# Patient Record
Sex: Female | Born: 1990 | Race: White | Hispanic: No | Marital: Single | Smoking: Never smoker
Health system: Southern US, Community
[De-identification: ages and names within clinical notes are randomized; demographics above are authoritative.]

## PROBLEM LIST (undated history)

## (undated) ENCOUNTER — Emergency Department (HOSPITAL_COMMUNITY): Admission: EM | Payer: 59

## (undated) DIAGNOSIS — R339 Retention of urine, unspecified: Secondary | ICD-10-CM

## (undated) DIAGNOSIS — R350 Frequency of micturition: Secondary | ICD-10-CM

## (undated) DIAGNOSIS — Z8742 Personal history of other diseases of the female genital tract: Secondary | ICD-10-CM

## (undated) DIAGNOSIS — G629 Polyneuropathy, unspecified: Secondary | ICD-10-CM

## (undated) DIAGNOSIS — F329 Major depressive disorder, single episode, unspecified: Secondary | ICD-10-CM

## (undated) DIAGNOSIS — A09 Infectious gastroenteritis and colitis, unspecified: Secondary | ICD-10-CM

## (undated) DIAGNOSIS — S8990XA Unspecified injury of unspecified lower leg, initial encounter: Secondary | ICD-10-CM

## (undated) DIAGNOSIS — G8929 Other chronic pain: Secondary | ICD-10-CM

## (undated) DIAGNOSIS — Z8719 Personal history of other diseases of the digestive system: Secondary | ICD-10-CM

## (undated) DIAGNOSIS — R87629 Unspecified abnormal cytological findings in specimens from vagina: Secondary | ICD-10-CM

## (undated) DIAGNOSIS — D259 Leiomyoma of uterus, unspecified: Secondary | ICD-10-CM

## (undated) DIAGNOSIS — Z8781 Personal history of (healed) traumatic fracture: Secondary | ICD-10-CM

## (undated) HISTORY — DX: Personal history of (healed) traumatic fracture: Z87.81

## (undated) HISTORY — DX: Unspecified injury of unspecified lower leg, initial encounter: S89.90XA

## (undated) HISTORY — DX: Infectious gastroenteritis and colitis, unspecified: A09

## (undated) HISTORY — DX: Unspecified abnormal cytological findings in specimens from vagina: R87.629

## (undated) HISTORY — PX: APPENDECTOMY: SHX54

---
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
## 2009-08-31 HISTORY — PX: LAPAROSCOPIC APPENDECTOMY: SHX408

## 2021-03-12 LAB — RESULTS CONSOLE HPV: CHL HPV: NEGATIVE

## 2021-03-12 LAB — HM PAP SMEAR: HM Pap smear: NORMAL
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
## 2021-10-02 ENCOUNTER — Encounter: Payer: Self-pay | Admitting: Nurse Practitioner

## 2021-10-02 ENCOUNTER — Ambulatory Visit (INDEPENDENT_AMBULATORY_CARE_PROVIDER_SITE_OTHER): Payer: 59 | Admitting: Nurse Practitioner

## 2021-10-02 ENCOUNTER — Other Ambulatory Visit: Payer: Self-pay

## 2021-10-02 VITALS — BP 110/88 | HR 81 | Temp 97.2°F | Ht 70.0 in | Wt 188.4 lb

## 2021-10-02 DIAGNOSIS — Z8639 Personal history of other endocrine, nutritional and metabolic disease: Secondary | ICD-10-CM | POA: Diagnosis not present

## 2021-10-02 DIAGNOSIS — R200 Anesthesia of skin: Secondary | ICD-10-CM | POA: Diagnosis not present

## 2021-10-02 DIAGNOSIS — R350 Frequency of micturition: Secondary | ICD-10-CM | POA: Diagnosis not present

## 2021-10-02 DIAGNOSIS — G8929 Other chronic pain: Secondary | ICD-10-CM | POA: Insufficient documentation

## 2021-10-02 DIAGNOSIS — Z7689 Persons encountering health services in other specified circumstances: Secondary | ICD-10-CM

## 2021-10-02 DIAGNOSIS — M25562 Pain in left knee: Secondary | ICD-10-CM

## 2021-10-02 DIAGNOSIS — Z1159 Encounter for screening for other viral diseases: Secondary | ICD-10-CM

## 2021-10-02 DIAGNOSIS — Z23 Encounter for immunization: Secondary | ICD-10-CM

## 2021-10-02 DIAGNOSIS — Z114 Encounter for screening for human immunodeficiency virus [HIV]: Secondary | ICD-10-CM

## 2021-10-02 DIAGNOSIS — R202 Paresthesia of skin: Secondary | ICD-10-CM

## 2021-10-02 DIAGNOSIS — F321 Major depressive disorder, single episode, moderate: Secondary | ICD-10-CM | POA: Insufficient documentation

## 2021-10-02 DIAGNOSIS — L659 Nonscarring hair loss, unspecified: Secondary | ICD-10-CM

## 2021-10-02 LAB — VITAMIN B12: Vitamin B-12: 504 pg/mL (ref 211–911)

## 2021-10-02 LAB — VITAMIN D 25 HYDROXY (VIT D DEFICIENCY, FRACTURES): VITD: 24.06 ng/mL — ABNORMAL LOW (ref 30.00–100.00)

## 2021-10-02 LAB — POCT URINALYSIS DIPSTICK
Bilirubin, UA: NEGATIVE
Blood, UA: NEGATIVE
Glucose, UA: NEGATIVE
Ketones, UA: NEGATIVE
Nitrite, UA: NEGATIVE
Protein, UA: NEGATIVE
Spec Grav, UA: 1.015 (ref 1.010–1.025)
Urobilinogen, UA: 1 E.U./dL
pH, UA: 7 (ref 5.0–8.0)

## 2021-10-02 LAB — CBC WITH DIFFERENTIAL/PLATELET
Basophils Absolute: 0.1 10*3/uL (ref 0.0–0.1)
Basophils Relative: 0.9 % (ref 0.0–3.0)
Eosinophils Absolute: 0.2 10*3/uL (ref 0.0–0.7)
Eosinophils Relative: 3.4 % (ref 0.0–5.0)
HCT: 42.6 % (ref 36.0–46.0)
Hemoglobin: 13.9 g/dL (ref 12.0–15.0)
Lymphocytes Relative: 23.9 % (ref 12.0–46.0)
Lymphs Abs: 1.5 10*3/uL (ref 0.7–4.0)
MCHC: 32.6 g/dL (ref 30.0–36.0)
MCV: 87.9 fl (ref 78.0–100.0)
Monocytes Absolute: 0.4 10*3/uL (ref 0.1–1.0)
Monocytes Relative: 6.4 % (ref 3.0–12.0)
Neutro Abs: 4.1 10*3/uL (ref 1.4–7.7)
Neutrophils Relative %: 65.4 % (ref 43.0–77.0)
Platelets: 119 10*3/uL — ABNORMAL LOW (ref 150.0–400.0)
RBC: 4.84 Mil/uL (ref 3.87–5.11)
RDW: 13.5 % (ref 11.5–15.5)
WBC: 6.3 10*3/uL (ref 4.0–10.5)

## 2021-10-02 LAB — COMPREHENSIVE METABOLIC PANEL
ALT: 8 U/L (ref 0–35)
AST: 12 U/L (ref 0–37)
Albumin: 4.4 g/dL (ref 3.5–5.2)
Alkaline Phosphatase: 82 U/L (ref 39–117)
BUN: 9 mg/dL (ref 6–23)
CO2: 26 mEq/L (ref 19–32)
Calcium: 9.4 mg/dL (ref 8.4–10.5)
Chloride: 106 mEq/L (ref 96–112)
Creatinine, Ser: 0.66 mg/dL (ref 0.40–1.20)
GFR: 117.16 mL/min (ref >60.00)
Glucose, Bld: 88 mg/dL (ref 70–99)
Potassium: 4.3 mEq/L (ref 3.5–5.1)
Sodium: 139 mEq/L (ref 135–145)
Total Bilirubin: 0.5 mg/dL (ref 0.2–1.2)
Total Protein: 7 g/dL (ref 6.0–8.3)

## 2021-10-02 LAB — TSH: TSH: 0.84 u[IU]/mL (ref 0.35–5.50)

## 2021-10-02 LAB — HEMOGLOBIN A1C: Hgb A1c MFr Bld: 5.3 % (ref 4.6–6.5)

## 2021-10-02 LAB — LUTEINIZING HORMONE: LH: 84.73 m[IU]/mL

## 2021-10-02 LAB — FOLLICLE STIMULATING HORMONE: FSH: 20.3 m[IU]/mL

## 2021-10-02 NOTE — Patient Instructions (Signed)
It was great to see you!  We are going to check some blood work today and will call you with the results.   I have placed a referral to orthopedics and therapy for you.   Let's follow-up in 2 months, sooner if you have concerns.  If a referral was placed today, you will be contacted for an appointment. Please note that routine referrals can sometimes take up to 3-4 weeks to process. Please call our office if you haven't heard anything after this time frame.  Take care,  Vance Peper, NP
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
## 2021-10-02 NOTE — Addendum Note (Signed)
Addended by: Marchia Bond on: 10/02/2021 04:03 PM   Modules accepted: Orders

## 2021-10-02 NOTE — Assessment & Plan Note (Signed)
Chronic pain after fall on her knee in 2017. Pain has worsened over the past few months. She has done PT in the past and had prior MRI. Will request records for imaging and records. She can take ibuprofen as needed for pain, continue using ice. Will place referral to orthopedics for further evaluation.

## 2021-10-02 NOTE — Assessment & Plan Note (Signed)
Ongoing for several years with loss of her job and moving from [place]. We discussed lifestyle treatments including meditation and journaling. We also discussed medication and therapy. She would like to hold off on medication for now, but would like to speak with a therapist. Referral placed to psychiatry.

## 2021-10-03 LAB — URINE CULTURE
MICRO NUMBER:: 12955235
Result:: NO GROWTH
SPECIMEN QUALITY:: ADEQUATE

## 2021-10-03 LAB — HIV ANTIBODY (ROUTINE TESTING W REFLEX): HIV 1&2 Ab, 4th Generation: NONREACTIVE

## 2021-10-03 LAB — HEPATITIS C ANTIBODY
Hepatitis C Ab: NONREACTIVE
SIGNAL TO CUT-OFF: 0.1 (ref <1.00)

## 2021-10-06 ENCOUNTER — Ambulatory Visit (INDEPENDENT_AMBULATORY_CARE_PROVIDER_SITE_OTHER): Payer: 59 | Admitting: Orthopaedic Surgery

## 2021-10-06 ENCOUNTER — Ambulatory Visit (INDEPENDENT_AMBULATORY_CARE_PROVIDER_SITE_OTHER): Payer: 59

## 2021-10-06 ENCOUNTER — Other Ambulatory Visit: Payer: Self-pay

## 2021-10-06 DIAGNOSIS — G8929 Other chronic pain: Secondary | ICD-10-CM | POA: Diagnosis not present

## 2021-10-06 DIAGNOSIS — M25562 Pain in left knee: Secondary | ICD-10-CM

## 2021-10-06 MED ORDER — METHYLPREDNISOLONE ACETATE 40 MG/ML IJ SUSP
40.0000 mg | INTRAMUSCULAR | Status: AC | PRN
  Administered 2021-10-06: 40 mg via INTRA_ARTICULAR

## 2021-10-06 MED ORDER — MELOXICAM 15 MG PO TABS: 15.0000 mg | ORAL_TABLET | Freq: Every day | ORAL | 3 refills | Status: DC

## 2021-10-06 MED ORDER — GABAPENTIN 300 MG PO CAPS: 300.0000 mg | ORAL_CAPSULE | Freq: Every day | ORAL | 0 refills | Status: DC

## 2021-10-06 MED ORDER — LIDOCAINE HCL 1 % IJ SOLN
3.0000 mL | INTRAMUSCULAR | Status: AC | PRN
  Administered 2021-10-06: 3 mL

## 2021-10-06 NOTE — Progress Notes (Signed)
++++++++++++++++++++++++++++++++++++++++++++++++++++++++++++++++++++++++++++++++++++++++++++++++++++++++++++++++++++++++++++++++++++++++++++++++++++++++++++++++++++++++++++++++++++++++++++++++++++++++++++++++++++++++++++++++++++++++++++++++++   Office Visit Note   Patient: Dominique [name]           Date of Birth: 1991/02/08           MRN: [id number] Visit Date: 10/06/2021              Requested by: [name], NP [place],  [place] [id number] PCP: [name], NP   Assessment & Plan: Visit Diagnoses:  1. Chronic pain of left knee     Plan: I did place a steroid injection today in her left knee.  I will start her on meloxicam and Neurontin given the neurologic burning sensation she is having at bedtime.  I would like to obtain an MRI at this point to assess the cartilage of her left knee and to rule out a medial meniscal tear based on her clinical exam findings and failure of conservative treatment.  We will see her back in about 3 weeks to hopefully go over the MRI of her left knee.  She did tolerate the steroid injection well today in her left knee.  All questions and concerns were answered and addressed.  She will work on Forensic scientist exercises as well.  Follow-Up Instructions: Return in about 3 weeks (around 10/27/2021).   Orders:  Orders Placed This Encounter  Procedures   Large Joint Inj   XR Knee 1-2 Views Left   Meds ordered this encounter  Medications   gabapentin (NEURONTIN) 300 MG capsule    Sig: Take 1 capsule (300 mg total) by mouth at bedtime.    Dispense:  30 capsule    Refill:  0   meloxicam (MOBIC) 15 MG tablet    Sig: Take 1 tablet (15 mg total) by mouth daily.    Dispense:  30 tablet    Refill:  3      Procedures: Large Joint Inj: L knee on 10/06/2021 1:17 PM Indications: diagnostic evaluation and pain Details: 22 G 1.5 in needle, superolateral approach  Arthrogram: No  Medications: 3 mL lidocaine 1 %; 40 mg  methylPREDNISolone acetate 40 MG/ML Outcome: tolerated well, no immediate complications Procedure, treatment alternatives, risks and benefits explained, specific risks discussed. Consent was given by the patient. Immediately prior to procedure a time out was called to verify the correct patient, procedure, equipment, support staff and site/side marked as required. Patient was prepped and draped in the usual sterile fashion.      Clinical Data: No additional findings.   Subjective: Chief Complaint  Patient presents with   Left Knee - Pain  The patient is a very pleasant 31 year old female has been dealing with 5 and half years of worsening left knee pain.  She has been having burning and stinging in the knee and a sensation that goes down the lateral aspect of both feet.  She is having some right right knee pain recently just from compensating due to her left chronic knee pain.  She had a work-up of this knee in [place] when she was there when she first injured her knee and a mechanical fall.  She did have swelling in the knee and they tried a steroid injection which helped only a little bit.  She continues to have swelling as well as locking catching without left knee.  She is an active individual and has tried quad training exercises and takes over-the-counter anti-inflammatories.  She does walk with a slight limp favoring the left knee.  HPI  Review of Systems  There is no current listed fever, chills, nausea, vomiting  Objective: Vital Signs: LMP 09/15/2021 (Exact Date)   Physical Exam She is alert and orient x3 and in no acute distress.  She does walk with a slight limp. Ortho Exam Her left knee shows no effusion today.  Range of motion is full.  She has tenderness over the medial joint line and the patella tendon and a positive McMurray's exam to the medial compartment of the knee. Specialty Comments:  No specialty comments available.  Imaging: XR Knee 1-2 Views Left  Result  Date: 10/06/2021 2 views of the left knee are normal with good alignment and well-maintained joint space.  There is no acute findings.    PMFS History: Patient Active Problem List   Diagnosis Date Noted   Depression, major, single episode, moderate ([place]) 10/02/2021   Numbness and tingling of foot 10/02/2021   Chronic pain of left knee 10/02/2021   Past Medical History:  Diagnosis Date   History of fracture of right ankle    Infectious colitis    was hospitalized in 2017   Knee injury    2017    Family History  Problem Relation Age of Onset   Rheum arthritis Mother    Cancer Father        Lung   Cancer Maternal Grandmother        breast cancer   Diabetes Maternal Grandmother    Cancer Paternal Grandmother        Uterine   Cancer Paternal Grandfather        Lung cancer    Past Surgical History:  Procedure Laterality Date   APPENDECTOMY     Social History   Occupational History   Not on file  Tobacco Use   Smoking status: Never   Smokeless tobacco: Never  Vaping Use   Vaping Use: Every day  Substance and Sexual Activity   Alcohol use: Not Currently   Drug use: Yes    Types: Marijuana   Sexual activity: Not Currently

## 2021-10-11 ENCOUNTER — Ambulatory Visit (HOSPITAL_COMMUNITY)
Admission: RE | Admit: 2021-10-11 | Discharge: 2021-10-11 | Disposition: A | Discharge status: Home / Self Care | Payer: 59 | Source: Ambulatory Visit | Attending: Orthopaedic Surgery | Admitting: Orthopaedic Surgery

## 2021-10-11 ENCOUNTER — Other Ambulatory Visit: Payer: Self-pay

## 2021-10-11 DIAGNOSIS — G8929 Other chronic pain: Secondary | ICD-10-CM | POA: Insufficient documentation

## 2021-10-11 DIAGNOSIS — M25562 Pain in left knee: Secondary | ICD-10-CM | POA: Diagnosis not present

## 2021-10-27 ENCOUNTER — Encounter: Payer: Self-pay | Admitting: Orthopaedic Surgery

## 2021-10-27 ENCOUNTER — Ambulatory Visit (INDEPENDENT_AMBULATORY_CARE_PROVIDER_SITE_OTHER): Payer: 59 | Admitting: Orthopaedic Surgery

## 2021-10-27 DIAGNOSIS — G8929 Other chronic pain: Secondary | ICD-10-CM

## 2021-10-27 DIAGNOSIS — M25562 Pain in left knee: Secondary | ICD-10-CM

## 2021-10-27 NOTE — Progress Notes (Signed)
The patient comes in today to go over MRI of her left knee.  She has been having some chronic issues with her left knee and it failed conservative treatment.  I did place a steroid injection in her left knee joint and she said that is helped greatly.  She did have about 24 to 48 hours of worsening left knee pain after the injection but now she is doing great.  We did obtain an MRI of that knee given the chronic pain she was having.  Her knee exam is entirely normal today.  The MRI did show some edema in Hoffa's fat suggesting a friction type of syndrome but otherwise the meniscus and cartilage as well as ligaments were all normal.  I gave her reassurance that her knee should do well with time.  I would not recommend any type of arthroscopic intervention and that she failed conservative treatment with at least 3 steroid injections for over a year and physical therapy.  Right now she is doing well.  All question concerns were answered and addressed.  Follow-up is as needed.

## 2021-12-09 ENCOUNTER — Ambulatory Visit (INDEPENDENT_AMBULATORY_CARE_PROVIDER_SITE_OTHER): Payer: 59 | Admitting: Nurse Practitioner

## 2021-12-09 ENCOUNTER — Encounter: Payer: Self-pay | Admitting: Nurse Practitioner

## 2021-12-09 VITALS — BP 120/84 | HR 72 | Temp 97.9°F | Wt 183.0 lb

## 2021-12-09 DIAGNOSIS — R21 Rash and other nonspecific skin eruption: Secondary | ICD-10-CM | POA: Diagnosis not present

## 2021-12-09 DIAGNOSIS — R339 Retention of urine, unspecified: Secondary | ICD-10-CM | POA: Insufficient documentation

## 2021-12-09 LAB — POCT URINALYSIS DIPSTICK
Bilirubin, UA: NEGATIVE
Blood, UA: NEGATIVE
Glucose, UA: NEGATIVE
Ketones, UA: 5
Leukocytes, UA: NEGATIVE
Nitrite, UA: NEGATIVE
Protein, UA: NEGATIVE
Spec Grav, UA: 1.02 (ref 1.010–1.025)
Urobilinogen, UA: 0.2 E.U./dL
pH, UA: 7 (ref 5.0–8.0)

## 2021-12-09 MED ORDER — METRONIDAZOLE 0.75 % EX GEL: 1.0000 "application " | Freq: Two times a day (BID) | CUTANEOUS | 0 refills | Status: DC

## 2021-12-09 MED ORDER — TRIAMCINOLONE ACETONIDE 0.1 % EX CREA: 1.0000 "application " | TOPICAL_CREAM | Freq: Two times a day (BID) | CUTANEOUS | 0 refills | Status: DC

## 2021-12-09 NOTE — Assessment & Plan Note (Signed)
She has a rash to her face and forearms that has worsened over the past month. She is wearing makeup today, but a picture on her phone shows redness and inflammation to her cheeks, nose, and forehead. Will treat with triamcinolone cream twice a day to the patches on her forearms and leg and metronidazole gel twice a day to her face. Will also check ANA to rule out lupus from the rash. Follow up if symptoms worsen or don't improve.  ?

## 2021-12-09 NOTE — Patient Instructions (Signed)
It was great to see you! ? ?We are checking your ANA for autoimmune diseases and U/A. I am placing a referral to urology for further evaluation on the difficulty to start urinating.  ? ?Start triamcinolone cream twice a day to the rash on your arms/leg. Start metronidazole gel to your cheeks/forehead twice a day.  ? ?Let's follow-up if your symptoms don't improve or worsen.  ? ?If a referral was placed today, you will be contacted for an appointment. Please note that routine referrals can sometimes take up to 3-4 weeks to process. Please call our office if you haven't heard anything after this time frame. ? ?Take care, ? ?Vance Peper, NP ? ?

## 2021-12-09 NOTE — Progress Notes (Signed)
? ?Acute Office Visit ? ?Subjective:  ? ? Patient ID: Dominique [name], female    DOB: 1990-10-07, 31 y.o.   MRN: [id number] ? ?Chief Complaint  ?Patient presents with  ? Follow-up  ?  Pt c/o bloating, urinary frequency and a feeling of not being able to release urine from bladder unless she pushes down on her abd, symptoms started x3 days  ? ? ?HPI ?Patient is in today for urinary frequency, bloating and having trouble starting her urinary stream, especially in the morning. She states that for the past 3 days, she needed to push on her stomach to start urinating in the mornings. Endorses a strong smell to her urine. Denies fevers, nausea, vomiting. She does endorse some back pain. Denies saddle anesthesia.  ? ?She also endorses a rash to her face along her cheeks and forehead. She feels like there is heat coming from it and feels a slight burning sensation. The was worsened over the past month. She also noticed small patches of dry, bumpy skin to her forearms and the top of her right knee. She denies any recent changes in soap, shampoo and laundry detergent.  ? ?Past Medical History:  ?Diagnosis Date  ? History of fracture of right ankle   ? Infectious colitis   ? was hospitalized in 2017  ? Knee injury   ? 2017  ? ? ?Past Surgical History:  ?Procedure Laterality Date  ? APPENDECTOMY    ? ? ?Family History  ?Problem Relation Age of Onset  ? Rheum arthritis Mother   ? Cancer Father   ?     Lung  ? Cancer Maternal Grandmother   ?     breast cancer  ? Diabetes Maternal Grandmother   ? Cancer Paternal Grandmother   ?     Uterine  ? Cancer Paternal Grandfather   ?     Lung cancer  ? ? ?Social History  ? ?Socioeconomic History  ? Marital status: Single  ?  Spouse name: Not on file  ? Number of children: Not on file  ? Years of education: Not on file  ? Highest education level: Not on file  ?Occupational History  ? Not on file  ?Tobacco Use  ? Smoking status: Never  ? Smokeless tobacco: Never  ?Vaping Use  ? Vaping Use:  Every day  ?Substance and Sexual Activity  ? Alcohol use: Not Currently  ? Drug use: Yes  ?  Types: Marijuana  ? Sexual activity: Not Currently  ?Other Topics Concern  ? Not on file  ?Social History Narrative  ? Not on file  ? ?Social Determinants of Health  ? ?Financial Resource Strain: Not on file  ?Food Insecurity: Not on file  ?Transportation Needs: Not on file  ?Physical Activity: Not on file  ?Stress: Not on file  ?Social Connections: Not on file  ?Intimate Partner Violence: Not on file  ? ? ?Outpatient Medications Prior to Visit  ?Medication Sig Dispense Refill  ? gabapentin (NEURONTIN) 300 MG capsule Take 1 capsule (300 mg total) by mouth at bedtime. 30 capsule 0  ? meloxicam (MOBIC) 15 MG tablet Take 1 tablet (15 mg total) by mouth daily. 30 tablet 3  ? ?No facility-administered medications prior to visit.  ? ? ?Allergies  ?Allergen Reactions  ? Ciprofloxacin Hives  ? ? ?Review of Systems ?See pertinent positives and negatives per HPI. ?   ?Objective:  ?  ?Physical Exam ?Vitals and nursing note reviewed.  ?Constitutional:   ?  General: She is not in acute distress. ?   Appearance: Normal appearance.  ?HENT:  ?   Head: Normocephalic.  ?Eyes:  ?   Conjunctiva/sclera: Conjunctivae normal.  ?Cardiovascular:  ?   Rate and Rhythm: Normal rate and regular rhythm.  ?   Pulses: Normal pulses.  ?   Heart sounds: Normal heart sounds.  ?Pulmonary:  ?   Effort: Pulmonary effort is normal.  ?   Breath sounds: Normal breath sounds.  ?Abdominal:  ?   General: There is no distension.  ?   Palpations: Abdomen is soft.  ?   Tenderness: There is abdominal tenderness (slight tenderness to LLQ). There is no right CVA tenderness, left CVA tenderness, guarding or rebound.  ?Musculoskeletal:  ?   Cervical back: Normal range of motion.  ?Skin: ?   General: Skin is warm.  ?   Findings: Rash present.  ?   Comments: Dry, scaly patch to bilateral forearms.   ?Neurological:  ?   General: No focal deficit present.  ?   Mental Status: She  is alert and oriented to person, place, and time.  ?Psychiatric:     ?   Mood and Affect: Mood normal.     ?   Behavior: Behavior normal.     ?   Thought Content: Thought content normal.     ?   Judgment: Judgment normal.  ? ? ?BP 120/84 (BP Location: Left Arm, Cuff Size: Normal)   Pulse 72   Temp 97.9 ?F (36.6 ?C) (Temporal)   Wt 183 lb (83 kg)   SpO2 100%   BMI 26.26 kg/m?  ?Wt Readings from Last 3 Encounters:  ?12/09/21 183 lb (83 kg)  ?10/02/21 188 lb 6.4 oz (85.5 kg)  ? ? ?There are no preventive care reminders to display for this patient. ? ?There are no preventive care reminders to display for this patient. ? ? ?Lab Results  ?Component Value Date  ? TSH 0.84 10/02/2021  ? ?Lab Results  ?Component Value Date  ? WBC 6.3 10/02/2021  ? HGB 13.9 10/02/2021  ? HCT 42.6 10/02/2021  ? MCV 87.9 10/02/2021  ? PLT 119.0 (L) 10/02/2021  ? ?Lab Results  ?Component Value Date  ? NA 139 10/02/2021  ? K 4.3 10/02/2021  ? CO2 26 10/02/2021  ? GLUCOSE 88 10/02/2021  ? BUN 9 10/02/2021  ? CREATININE 0.66 10/02/2021  ? BILITOT 0.5 10/02/2021  ? ALKPHOS 82 10/02/2021  ? AST 12 10/02/2021  ? ALT 8 10/02/2021  ? PROT 7.0 10/02/2021  ? ALBUMIN 4.4 10/02/2021  ? CALCIUM 9.4 10/02/2021  ? GFR 117.16 10/02/2021  ? ?No results found for: CHOL ?No results found for: HDL ?No results found for: [place] ?No results found for: TRIG ?No results found for: CHOLHDL ?Lab Results  ?Component Value Date  ? HGBA1C 5.3 10/02/2021  ? ? ?   ?Assessment & Plan:  ? ?Problem List Items Addressed This Visit   ? ?  ? Musculoskeletal and Integument  ? Rash - Primary  ?  She has a rash to her face and forearms that has worsened over the past month. She is wearing makeup today, but a picture on her phone shows redness and inflammation to her cheeks, nose, and forehead. Will treat with triamcinolone cream twice a day to the patches on her forearms and leg and metronidazole gel twice a day to her face. Will also check ANA to rule out lupus from the rash.  Follow up if symptoms  worsen or don't improve.  ?  ?  ? Relevant Orders  ? ANA w/Reflex  ?  ? Genitourinary  ? Urine retention  ?  Acute difficulty starting her urinary stream for the last 3 days. She states she needs to push on her stomach to start urinating. U/A in the office was negative. Denies saddle anesthesia. Will place referral to urology for further evaluation.  ?  ?  ? Relevant Orders  ? POCT urinalysis dipstick (Completed)  ? Ambulatory referral to Urology  ? ? ? ?Meds ordered this encounter  ?Medications  ? triamcinolone cream (KENALOG) 0.1 %  ?  Sig: Apply 1 application. topically 2 (two) times daily. Do not use on your face  ?  Dispense:  30 g  ?  Refill:  0  ? metroNIDAZOLE (METROGEL) 0.75 % gel  ?  Sig: Apply 1 application. topically 2 (two) times daily. Face  ?  Dispense:  45 g  ?  Refill:  0  ? ? ? ?[name], NP ? ?

## 2021-12-09 NOTE — Assessment & Plan Note (Signed)
Acute difficulty starting her urinary stream for the last 3 days. She states she needs to push on her stomach to start urinating. U/A in the office was negative. Denies saddle anesthesia. Will place referral to urology for further evaluation.  ?

## 2021-12-11 LAB — ANA W/REFLEX: Anti Nuclear Antibody (ANA): NEGATIVE

## 2021-12-25 ENCOUNTER — Encounter: Payer: Self-pay | Admitting: Nurse Practitioner

## 2021-12-25 DIAGNOSIS — R339 Retention of urine, unspecified: Secondary | ICD-10-CM

## 2021-12-25 DIAGNOSIS — D259 Leiomyoma of uterus, unspecified: Secondary | ICD-10-CM

## 2021-12-27 ENCOUNTER — Ambulatory Visit (HOSPITAL_BASED_OUTPATIENT_CLINIC_OR_DEPARTMENT_OTHER)
Admission: RE | Admit: 2021-12-27 | Discharge: 2021-12-27 | Disposition: A | Discharge status: Home / Self Care | Payer: 59 | Source: Ambulatory Visit | Attending: Nurse Practitioner | Admitting: Nurse Practitioner

## 2021-12-27 DIAGNOSIS — R339 Retention of urine, unspecified: Secondary | ICD-10-CM | POA: Insufficient documentation

## 2022-02-02 ENCOUNTER — Encounter: Payer: Self-pay | Admitting: Urology

## 2022-02-02 ENCOUNTER — Ambulatory Visit: Payer: 59 | Admitting: Urology

## 2022-02-02 VITALS — BP 110/73 | HR 60 | Ht 70.0 in | Wt 180.0 lb

## 2022-02-02 DIAGNOSIS — R339 Retention of urine, unspecified: Secondary | ICD-10-CM | POA: Diagnosis not present

## 2022-02-02 DIAGNOSIS — R351 Nocturia: Secondary | ICD-10-CM

## 2022-02-02 LAB — URINALYSIS, COMPLETE
Bilirubin, UA: NEGATIVE
Glucose, UA: NEGATIVE
Ketones, UA: NEGATIVE
Leukocytes,UA: NEGATIVE
Nitrite, UA: NEGATIVE
Protein,UA: NEGATIVE
RBC, UA: NEGATIVE
Specific Gravity, UA: 1.025 (ref 1.005–1.030)
Urobilinogen, Ur: 0.2 mg/dL (ref 0.2–1.0)
pH, UA: 7 (ref 5.0–7.5)

## 2022-02-02 LAB — MICROSCOPIC EXAMINATION

## 2022-02-02 LAB — BLADDER SCAN AMB NON-IMAGING

## 2022-02-02 NOTE — Progress Notes (Signed)
02/02/2022 10:15 AM   Dominique [name] 1991-05-14 [id number]  Referring provider: [name], NP [place],  [place] [address]  Chief Complaint  Patient presents with   New Patient (Initial Visit)   Urinary Retention    HPI: I was consulted to assess the patient for frequency and incomplete bladder emptying.  Last fall she was voiding 15-20 times a night.  In February she woke up with a full bladder and was very uncomfortable and could not void and she had to void by straining.  Sometimes she cannot void and 1 hour later can.  When she has had retention in the past she has had some tingling in her hands and teeth.  She is voiding every 90 minutes and generally getting up 10-12 times at night.  Flow varies.  Generally is weak.  Sometimes better than others.  She can feel empty even with a high residual like today.  She can sit for 10 minutes or double void a small amount.  She is continent  She has been getting tingling in her feet mainly on the left and more recent on the right.  She is on gabapentin for this.  No vaginal or rectal numbness.  Bowel movements normal  Her recent abdominal ultrasound demonstrated a 9 cm uterine fibroid.  Normal kidneys with no hydronephrosis.  Importantly her prevoid volume was 264 mL and her postvoid volume was 31 mL I believe she is seeing a gynecologist with likely a pelvic ultrasound in July  No recent urinary tract infections.  No history of bladder surgery kidney stones.   PMH: Past Medical History:  Diagnosis Date   History of fracture of right ankle    Infectious colitis    was hospitalized in 2017   Knee injury    2017    Surgical History: Past Surgical History:  Procedure Laterality Date   APPENDECTOMY      Home Medications:  Allergies as of 02/02/2022       Reactions   Ciprofloxacin Hives        Medication List        Accurate as of February 02, 2022 10:15 AM. If you have any questions, ask your  nurse or doctor.          gabapentin 300 MG capsule Commonly known as: Neurontin Take 1 capsule (300 mg total) by mouth at bedtime.   meloxicam 15 MG tablet Commonly known as: MOBIC Take 1 tablet (15 mg total) by mouth daily.   metroNIDAZOLE 0.75 % gel Commonly known as: METROGEL Apply 1 application. topically 2 (two) times daily. Face   triamcinolone cream 0.1 % Commonly known as: KENALOG Apply 1 application. topically 2 (two) times daily. Do not use on your face        Allergies:  Allergies  Allergen Reactions   Ciprofloxacin Hives    Family History: Family History  Problem Relation Age of Onset   Rheum arthritis Mother    Cancer Father        Lung   Cancer Maternal Grandmother        breast cancer   Diabetes Maternal Grandmother    Cancer Paternal Grandmother        Uterine   Cancer Paternal Grandfather        Lung cancer    Social History:  reports that she has never smoked. She has never been exposed to tobacco smoke. She has never used smokeless tobacco. She reports that she does  not currently use alcohol. She reports current drug use. Drug: Marijuana.  ROS:                                        Physical Exam: There were no vitals taken for this visit.  Constitutional:  Alert and oriented, No acute distress. HEENT: [place] AT, moist mucus membranes.  Trachea midline, no masses. Cardiovascular: No clubbing, cyanosis, or edema. Respiratory: Normal respiratory effort, no increased work of breathing. GI: Abdomen is soft, nontender, nondistended, no abdominal masses GU: Mild grade 2 hypermobility bladder neck negative cough this.  Small grade 1 cystocele.  No rectocele.  Normal vaginal sensation Skin: No rashes, bruises or suspicious lesions. Lymph: No cervical or inguinal adenopathy. Neurologic: Grossly intact, no focal deficits, moving all 4 extremities. Psychiatric: Normal mood and affect.  Laboratory Data: Lab Results   Component Value Date   WBC 6.3 10/02/2021   HGB 13.9 10/02/2021   HCT 42.6 10/02/2021   MCV 87.9 10/02/2021   PLT 119.0 (L) 10/02/2021    Lab Results  Component Value Date   CREATININE 0.66 10/02/2021    No results found for: PSA  No results found for: TESTOSTERONE  Lab Results  Component Value Date   HGBA1C 5.3 10/02/2021    Urinalysis    Component Value Date/Time   BILIRUBINUR NEG 12/09/2021 1510   PROTEINUR Negative 12/09/2021 1510   UROBILINOGEN 0.2 12/09/2021 1510   NITRITE neg 12/09/2021 1510   LEUKOCYTESUR Negative 12/09/2021 1510    Pertinent Imaging: Urine negative.  Urine sent for culture.  Scanned for 339 mL.  She double void and she was scanned for 220 mL  Assessment & Plan: Patient's presentation is somewhat out of the ordinary.  She has severe nocturia especially for age.  She has high residuals with no hydronephrosis.  She had 1 measured normal residual during the abdominal ultrasound . pathophysiology of emptying well was discussed.  Role of urodynamics and cystoscopy discussed.  She does have some vague tingling.  She understands that rarely a uterine fibroid could cause obstructive voiding.  I did mention rarely the neurologic system could be involved with some of her symptom complex.  Call if urine culture is positive.  I did not order CT scan.  I will check for position of fibroid relative to the bladder neck during cystoscopy.  1. Urine retention  - Urinalysis, Complete - BLADDER SCAN AMB NON-IMAGING   No follow-ups on file.  Reece Packer, MD  [facility] [address] [place],  [address] [phone]

## 2022-02-02 NOTE — Patient Instructions (Signed)

## 2022-02-05 LAB — CULTURE, URINE COMPREHENSIVE

## 2022-03-12 ENCOUNTER — Ambulatory Visit (HOSPITAL_BASED_OUTPATIENT_CLINIC_OR_DEPARTMENT_OTHER)
Admission: RE | Admit: 2022-03-12 | Discharge: 2022-03-12 | Disposition: A | Discharge status: Home / Self Care | Payer: 59 | Source: Ambulatory Visit | Attending: Family Medicine | Admitting: Family Medicine

## 2022-03-12 ENCOUNTER — Ambulatory Visit: Payer: 59 | Admitting: Family Medicine

## 2022-03-12 ENCOUNTER — Encounter: Payer: Self-pay | Admitting: Family Medicine

## 2022-03-12 VITALS — BP 124/76 | HR 72 | Ht 71.0 in | Wt 190.0 lb

## 2022-03-12 DIAGNOSIS — D259 Leiomyoma of uterus, unspecified: Secondary | ICD-10-CM

## 2022-03-12 DIAGNOSIS — R339 Retention of urine, unspecified: Secondary | ICD-10-CM | POA: Diagnosis not present

## 2022-03-12 NOTE — Progress Notes (Addendum)
   Subjective:    Patient ID: Dominique [name], female    DOB: [birth year], 31 y.o.   MRN: [id number]  HPI This is a 31 year old G0 who presents with urinary retention over the past several months.  She was initially seen by her primary care provider who obtained a renal ultrasound and referred her to urology.  Renal ultrasound showed a postvoid residual of 31 mL. The ultrasound also showed a presence of a 9 cm fibroid.  The patient has significant nocturia and has difficulty starting to urinate.  She has to strain or push on her bladder in order to void.  Her urinary stream is generally weak.  Her menses is regular with approximately 28 days interval.  She has 4 to 7 days of bleeding with the first 4 days that are rather heavy.  She is generally fairly healthy.  She did see an OB/GYN and 2022 and had a normal Pap smear.  No history of abnormal Pap smears.  She did have some pelvic pain with intercourse at that time.   Review of Systems     Objective:   Physical Exam Vitals reviewed.  Constitutional:      Appearance: Normal appearance.  Genitourinary:    Comments: Normal external genitalia.  No lymphadenopathy or inguinal masses.  Cervix appears normal.  Enlarged, retroverted uterus with anterior fullness.  No adnexal tenderness or masses. Skin:    General: Skin is warm and dry.     Capillary Refill: Capillary refill takes less than 2 seconds.  Neurological:     General: No focal deficit present.     Mental Status: She is alert.  Psychiatric:        Mood and Affect: Mood normal.        Behavior: Behavior normal.        Thought Content: Thought content normal.      Assessment & Plan:   1. Uterine leiomyoma, unspecified location We will get transvaginal ultrasound.  Management will depend on location and the type of fibroid.  Due to the size of the fibroid and the presumed proximity to the bladder, Sonata would be contraindicated at this point.  We briefly discussed surgical  management versus embolization with interventional radiology.  Due to the patient's discomfort and urinary complaints, expedited treatment may be warranted.  Patient scheduled for ultrasound later today..  We will follow-up with Dr. Damita Dunnings tomorrow to discuss management - US PELVIS TRANSVAGINAL NON-OB (TV ONLY); Future  2. Urine retention

## 2022-03-12 NOTE — Progress Notes (Signed)
Patient having urinary retention issues. Imaging done April 2023  Fibroid close to bladder.  Patient has last pap in May 2022- in [place].[facility]

## 2022-03-13 ENCOUNTER — Encounter: Payer: Self-pay | Admitting: Obstetrics and Gynecology

## 2022-03-13 ENCOUNTER — Other Ambulatory Visit: Payer: Self-pay | Admitting: Obstetrics and Gynecology

## 2022-03-13 ENCOUNTER — Ambulatory Visit: Payer: 59 | Admitting: Obstetrics and Gynecology

## 2022-03-13 VITALS — BP 139/90 | HR 85 | Ht 71.0 in

## 2022-03-13 DIAGNOSIS — D259 Leiomyoma of uterus, unspecified: Secondary | ICD-10-CM | POA: Diagnosis not present

## 2022-03-13 MED ORDER — MYFEMBREE 40-1-0.5 MG PO TABS: 1.0000 | ORAL_TABLET | Freq: Every day | ORAL | 1 refills | Status: DC

## 2022-03-13 NOTE — Progress Notes (Addendum)
GYNECOLOGY OFFICE VISIT NOTE  History:   Dominique [name] is a 31 y.o. G0P0000 here today for follow up from her [place] regarding her fibroid which is symptomatic causing urinary retention. She confirms she does not have heavy periods.   She would like to keep her options for child bearing.    Addendum: After the visit, the patient sent me a MyChart message clarifying: "I definitely feel some pelvic pain along with my lower back - this is a sharp intense pain. I also feel it when I sit down as that sharp pain shoots up. I have also noticed my periods are heavy, but end almost abruptly, rather than gradually moving to a light bleeding at the end of a period - I just wanted to have that notated!"    Past Medical History:  Diagnosis Date   History of fracture of right ankle    Infectious colitis    was hospitalized in 2017   Knee injury    2017   Vaginal Pap smear, abnormal     Past Surgical History:  Procedure Laterality Date   APPENDECTOMY      The following portions of the patient's history were reviewed and updated as appropriate: allergies, current medications, past family history, past medical history, past social history, past surgical history and problem list.   Health Maintenance:   She reports a normal pap smear in 2022.   Review of Systems:  Pertinent items noted in HPI and remainder of comprehensive ROS otherwise negative.  Physical Exam:  BP 139/90   Pulse 85   Ht '5\' 11"'$  (1.803 m)   LMP 03/02/2022   BMI 26.50 kg/m  CONSTITUTIONAL: Well-developed, well-nourished female in no acute distress.  HEENT:  Normocephalic, atraumatic. External right and left ear normal. No scleral icterus.  NECK: Normal range of motion, supple, no masses noted on observation SKIN: No rash noted. Not diaphoretic. No erythema. No pallor. MUSCULOSKELETAL: Normal range of motion. No edema noted. NEUROLOGIC: Alert and oriented to person, place, and time. Normal muscle tone coordination. No  cranial nerve deficit noted. PSYCHIATRIC: Normal mood and affect. Normal behavior. Normal judgment and thought content.  CARDIOVASCULAR: Normal heart rate noted RESPIRATORY: Effort and breath sounds normal, no problems with respiration noted ABDOMEN: No masses noted. No other overt distention noted.    PELVIC: Deferred  Labs and Imaging No results found for this or any previous visit (from the past 168 hour(s)). US PELVIS TRANSVAGINAL NON-OB (TV ONLY)  Result Date: 03/13/2022 CLINICAL DATA:  Initial evaluation for uterine fibroid. EXAM: ULTRASOUND PELVIS TRANSVAGINAL TECHNIQUE: Transvaginal ultrasound examination of the pelvis was performed including evaluation of the uterus, ovaries, adnexal regions, and pelvic cul-de-sac. COMPARISON:  Prior renal ultrasound from 12/27/2021. FINDINGS: Uterus A large uterine fibroid measuring 11.7 x 9.4 x 10.4 cm is seen within the mid pelvis. The underlying uterus is not well seen or characterized. Endometrium Not visualized, obscured by overlying fibroid. Right ovary Not visualized.  No adnexal mass. Left ovary Not visualized.  No adnexal mass. Other findings:  No abnormal free fluid IMPRESSION: 1. Large fibroid measuring 11.7 x 9.4 x 10.4 cm within the mid pelvis, obscuring the underlying uterus and endometrium. 2. Nonvisualization of either ovary. No other adnexal mass or free fluid. Electronically Signed   By: Jeannine Boga M.D.   On: 03/13/2022 03:57    Assessment and Plan:   1. Uterine leiomyoma, unspecified location - Discussed options in the context that she loses her insurance on August 31st. She  would like to have surgery to remove the fibroid.  - We discussed the option of L/S myomectomy but that there would be a high risk of conversation to open. She has no preference and primarily is focused on havign it taken care of while she has insurance and sooner due to the urinary symptoms she has.  - Risks of surgery include but are not limited to:  bleeding, infection, injury to surrounding organs/tissues (i.e. bowel/bladder/ureters), need for additional procedures, wound complications, hospital re-admission, and conversion to open surgery - Importantly we discussed that it is almost definite based on the size of the fibroid that we would recommend a 1LTCS afterward due to risk of uterine rupture. Discussed the risks of uterine rupture.  - If covered by insurance would pretreat with Barbette Merino or Myfembree to help reduce size and EBL and potentially incision size. Usually would prefer 3 months of therapy but given time constraints this may not be possible.  - We discussed postop restrictions, precautions and expectations. Discussed typically an overnight stay if open surgery.  - Offered referral to [facility]. She would like to do whatever gets her surgery before August 31st. I made no promises but told her we would see if we could accommodate and if not, I would refer to [facility].  - Preop testing needed: None - All questions answered - Relugolix-Estradiol-Norethind (MYFEMBREE) 40-1-0.5 MG TABS; Take 1 tablet by mouth daily.  Dispense: 28 tablet; Refill: 1   Routine preventative health maintenance measures emphasized. Please refer to After Visit Summary for other counseling recommendations.   No follow-ups on file.  Radene Gunning, MD, [place] [facility] [place]

## 2022-03-14 ENCOUNTER — Encounter: Payer: Self-pay | Admitting: Obstetrics and Gynecology

## 2022-03-17 ENCOUNTER — Telehealth: Payer: Self-pay | Admitting: General Practice

## 2022-03-17 NOTE — Telephone Encounter (Signed)
Patient is scheduled for surgery on 05/19/2022.  Informed patient of physician fee of $1599.00 will need to be collected prior to surgery if she doesn't have insurance and will need to make arrangements for hospital & anesthesia billing.  Pt verbalized understanding.

## 2022-03-26 ENCOUNTER — Encounter: Payer: Self-pay | Admitting: Urology

## 2022-04-03 ENCOUNTER — Encounter: Payer: Self-pay | Admitting: Urology

## 2022-04-06 ENCOUNTER — Encounter: Payer: Self-pay | Admitting: Urology

## 2022-04-06 ENCOUNTER — Ambulatory Visit: Payer: Commercial Managed Care - HMO | Admitting: Urology

## 2022-04-06 VITALS — BP 110/77 | HR 69 | Ht 71.0 in | Wt 190.0 lb

## 2022-04-06 DIAGNOSIS — R339 Retention of urine, unspecified: Secondary | ICD-10-CM | POA: Diagnosis not present

## 2022-04-06 LAB — URINALYSIS, COMPLETE
Bilirubin, UA: NEGATIVE
Glucose, UA: NEGATIVE
Ketones, UA: NEGATIVE
Leukocytes,UA: NEGATIVE
Nitrite, UA: NEGATIVE
Protein,UA: NEGATIVE
RBC, UA: NEGATIVE
Specific Gravity, UA: 1.025 (ref 1.005–1.030)
Urobilinogen, Ur: 0.2 mg/dL (ref 0.2–1.0)
pH, UA: 6.5 (ref 5.0–7.5)

## 2022-04-06 LAB — BLADDER SCAN AMB NON-IMAGING

## 2022-04-06 LAB — MICROSCOPIC EXAMINATION

## 2022-04-06 MED ORDER — TAMSULOSIN HCL 0.4 MG PO CAPS: 0.4000 mg | ORAL_CAPSULE | Freq: Every day | ORAL | 11 refills | Status: DC

## 2022-04-06 NOTE — Addendum Note (Signed)
Addended by: Despina Hidden on: 04/06/2022 11:49 AM   Modules accepted: Orders

## 2022-04-06 NOTE — Progress Notes (Signed)
04/06/2022 11:04 AM   Dominique [name] 1991-08-20 [id number]  Referring provider: [name], NP [place],  [place] [address]  No chief complaint on file.   HPI: I was consulted to assess the patient for frequency and incomplete bladder emptying.  Last fall she was voiding 15-20 times a night.  In February she woke up with a full bladder and was very uncomfortable and could not void and she had to void by straining.  Sometimes she cannot void and 1 hour later can.  When she has had retention in the past she has had some tingling in her hands and teeth.  She is voiding every 90 minutes and generally getting up 10-12 times at night.  Flow varies.  Generally is weak.  Sometimes better than others.  She can feel empty even with a high residual like today.  She can sit for 10 minutes or double void a small amount.  She is continent  She has been getting tingling in her feet mainly on the left and more recent on the right.  She is on gabapentin for this.  No vaginal or rectal numbness.  Bowel movements normal   Her recent abdominal ultrasound demonstrated a 9 cm uterine fibroid.  Normal kidneys with no hydronephrosis.  Importantly her prevoid volume was 264 mL and her postvoid volume was 31 mL I believe she is seeing a gynecologist with likely a pelvic ultrasound in July    Mild grade 2 hypermobility bladder neck negative cough this.  Small grade 1 cystocele.  No rectocele.  Normal vaginal sensation  She double void and she was scanned for 220 mL  Patient's presentation is somewhat out of the ordinary.  She has severe nocturia especially for age.  She has high residuals with no hydronephrosis.  She had 1 measured normal residual during the abdominal ultrasound . pathophysiology of emptying well was discussed.  Role of urodynamics and cystoscopy discussed.  She does have some vague tingling.  She understands that rarely a uterine fibroid could cause obstructive voiding.   I did mention rarely the neurologic system could be involved with some of her symptom complex.  Call if urine culture is positive.  I did not order CT scan.  I will check for position of fibroid relative to the bladder neck during cystoscopy.  Today Frequency stable.  Incomplete bladder emptying stable.  Scanned today for 325 mL  Is scheduled for myomectomy of uterine fibroid in September.  Did not have urodynamics due to insurance reasons but has changed.  On pelvic examination mild hypermobility of the bladder neck.  No significant cystocele.  Cystoscopy: Patient underwent flexible cystoscopy.  Bladder mucosa and trigone were normal.  There was a lot of air bubbles.  It may have been more difficult to fill her bladder perhaps from her uterus.  Having said that no significant indentation of the bladder near bladder neck from fibroids.    PMH: Past Medical History:  Diagnosis Date   History of fracture of right ankle    Infectious colitis    was hospitalized in 2017   Knee injury    2017   Vaginal Pap smear, abnormal     Surgical History: Past Surgical History:  Procedure Laterality Date   APPENDECTOMY      Home Medications:  Allergies as of 04/06/2022       Reactions   Ciprofloxacin Hives        Medication List  Accurate as of April 06, 2022 11:04 AM. If you have any questions, ask your nurse or doctor.          gabapentin 300 MG capsule Commonly known as: Neurontin Take 1 capsule (300 mg total) by mouth at bedtime.   meloxicam 15 MG tablet Commonly known as: MOBIC Take 1 tablet (15 mg total) by mouth daily.   metroNIDAZOLE 0.75 % gel Commonly known as: METROGEL Apply 1 application. topically 2 (two) times daily. Face   Myfembree 40-1-0.5 MG Tabs Generic drug: Relugolix-Estradiol-Norethind Take 1 tablet by mouth daily.   triamcinolone cream 0.1 % Commonly known as: KENALOG Apply 1 application. topically 2 (two) times daily. Do not use on your  face        Allergies:  Allergies  Allergen Reactions   Ciprofloxacin Hives    Family History: Family History  Problem Relation Age of Onset   Rheum arthritis Mother    Cancer Father        Lung   Cancer Maternal Grandmother        breast cancer   Diabetes Maternal Grandmother    Cancer Paternal Grandmother        Uterine   Cancer Paternal Grandfather        Lung cancer    Social History:  reports that she has never smoked. She has never been exposed to tobacco smoke. She has never used smokeless tobacco. She reports that she does not currently use alcohol. She reports current drug use. Drug: Marijuana.  ROS:                                        Physical Exam: LMP 03/02/2022   Constitutional:  Alert and oriented, No acute distress. HEENT: [place] AT, moist mucus membranes.  Trachea midline, no masses.  Laboratory Data: Lab Results  Component Value Date   WBC 6.3 10/02/2021   HGB 13.9 10/02/2021   HCT 42.6 10/02/2021   MCV 87.9 10/02/2021   PLT 119.0 (L) 10/02/2021    Lab Results  Component Value Date   CREATININE 0.66 10/02/2021    No results found for: "PSA"  No results found for: "TESTOSTERONE"  Lab Results  Component Value Date   HGBA1C 5.3 10/02/2021    Urinalysis    Component Value Date/Time   APPEARANCEUR Clear 02/02/2022 1004   GLUCOSEU Negative 02/02/2022 1004   BILIRUBINUR Negative 02/02/2022 1004   PROTEINUR Negative 02/02/2022 1004   UROBILINOGEN 0.2 12/09/2021 1510   NITRITE Negative 02/02/2022 1004   LEUKOCYTESUR Negative 02/02/2022 1004    Pertinent Imaging:   Assessment & Plan: I thought it was best to put her on Flomax and hold off on the urodynamics until we see how she does following her myomectomy.  We can always order the study if she is not significantly better.  1. Urine retention    No follow-ups on file.  Reece Packer, MD  [facility] [address],  [place], [place] [address] [phone]

## 2022-04-09 LAB — CULTURE, URINE COMPREHENSIVE

## 2022-04-30 ENCOUNTER — Encounter (HOSPITAL_BASED_OUTPATIENT_CLINIC_OR_DEPARTMENT_OTHER): Payer: Self-pay | Admitting: Obstetrics and Gynecology

## 2022-04-30 NOTE — Progress Notes (Signed)
Spoke w/ via phone for pre-op interview--- pt Lab needs dos----  urine preg (per anes)/  pre-op orders pending             Lab results------ no COVID test -----patient states asymptomatic no test needed Arrive at ------- 1015 on 05-19-2022 NPO after MN NO Solid Food.  Clear liquids from MN until--- 0915 Med rec completed Medications to take morning of surgery ----- none Diabetic medication ----- n/a Patient instructed no nail polish to be worn day of surgery Patient instructed to bring photo id and insurance card day of surgery Patient aware to have Driver (ride ) / caregiver for 24 hours after surgery --- mother, Dominique [name] Patient Special Instructions ----- reveiwed [facility] and visitor guidelines Pre-Op special Istructions ----- sent inbox message to dr Damita Dunnings in epic,  requested pre-op orders Patient verbalized understanding of instructions that were given at this phone interview. Patient denies shortness of breath, chest pain, fever, cough at this phone interview.

## 2022-05-19 ENCOUNTER — Encounter: Payer: Self-pay | Admitting: Obstetrics and Gynecology

## 2022-05-19 ENCOUNTER — Ambulatory Visit (HOSPITAL_BASED_OUTPATIENT_CLINIC_OR_DEPARTMENT_OTHER): Admission: RE | Admit: 2022-05-19 | Payer: 59 | Source: Ambulatory Visit | Admitting: Obstetrics and Gynecology

## 2022-05-19 ENCOUNTER — Telehealth: Payer: Self-pay

## 2022-05-19 DIAGNOSIS — Z01818 Encounter for other preprocedural examination: Secondary | ICD-10-CM

## 2022-05-19 DIAGNOSIS — D219 Benign neoplasm of connective and other soft tissue, unspecified: Secondary | ICD-10-CM

## 2022-05-19 HISTORY — DX: Major depressive disorder, single episode, unspecified: F32.9

## 2022-05-19 HISTORY — DX: Other chronic pain: G89.29

## 2022-05-19 HISTORY — DX: Personal history of other diseases of the digestive system: Z87.19

## 2022-05-19 HISTORY — DX: Leiomyoma of uterus, unspecified: D25.9

## 2022-05-19 HISTORY — DX: Personal history of other diseases of the female genital tract: Z87.42

## 2022-05-19 HISTORY — DX: Polyneuropathy, unspecified: G62.9

## 2022-05-19 HISTORY — DX: Frequency of micturition: R35.0

## 2022-05-19 HISTORY — DX: Retention of urine, unspecified: R33.9

## 2022-05-19 SURGERY — MYOMECTOMY, LAPAROSCOPIC
Anesthesia: Choice

## 2022-05-19 NOTE — Telephone Encounter (Signed)
Patient called after hours line stating that she was suppose to have surgery and she tested positive for covid. Kathrene Alu RN

## 2022-05-28 ENCOUNTER — Telehealth: Payer: Self-pay | Admitting: *Deleted

## 2022-05-28 NOTE — Telephone Encounter (Signed)
Returned call from 8:28 AM. Patient advised to call surgery scheduler to reschedule surgery.

## 2022-07-03 NOTE — Pre-Procedure Instructions (Signed)
Surgical Instructions    Your procedure is scheduled on Tuesday, November 14. 2023 at 12:46 PM.  Report to [facility] Entrance "A" at 10:45 A.M., then check in with the Admitting office.  Call this number if you have problems the morning of surgery:  [phone]   If you have any questions prior to your surgery date call [phone]: Open Monday-Friday 8am-4pm  *If you experience any cold or flu symptoms such as cough, fever, chills, shortness of breath, etc. between now and your scheduled surgery, please notify us.*    Remember:  Do not eat or drink after midnight the night before your surgery   Take these medicines the morning of surgery with A SIP OF WATER:   Tetrahydrozoline HCl (VISINE OP) - if needed   As of today, STOP taking any Aspirin (unless otherwise instructed by your surgeon) Aleve, Naproxen, Ibuprofen, Motrin, Advil, Goody's, BC's, all herbal medications, fish oil, and all vitamins.                     Do NOT Smoke (Tobacco/Vaping) for 24 hours prior to your procedure.  If you use a CPAP at night, you may bring your mask/headgear for your overnight stay.   Contacts, glasses, piercing's, hearing aid's, dentures or partials may not be worn into surgery, please bring cases for these belongings.    For patients admitted to the hospital, discharge time will be determined by your treatment team.   Patients discharged the day of surgery will not be allowed to drive home, and someone needs to stay with them for 24 hours.  SURGICAL WAITING ROOM VISITATION Patients having surgery or a procedure may have two support people in the waiting area. Visitors may stay in the waiting area during the procedure and switch out with other visitors if needed. Children under the age of 10 must have an adult accompany them who is not the patient. If the patient needs to stay at the hospital during part of their recovery, the visitor guidelines for inpatient rooms apply.  Please  refer to the [facility] website for the visitor guidelines for Inpatients (after your surgery is over and you are in a regular room).    Special instructions:   [facility]- Preparing For Surgery  Before surgery, you can play an important role. Because skin is not sterile, your skin needs to be as free of germs as possible. You can reduce the number of germs on your skin by washing with CHG (chlorahexidine gluconate) Soap before surgery.  CHG is an antiseptic cleaner which kills germs and bonds with the skin to continue killing germs even after washing.    Oral Hygiene is also important to reduce your risk of infection.  Remember - BRUSH YOUR TEETH THE MORNING OF SURGERY WITH YOUR REGULAR TOOTHPASTE  Please do not use if you have an allergy to CHG or antibacterial soaps. If your skin becomes reddened/irritated stop using the CHG.  Do not shave (including legs and underarms) for at least 48 hours prior to first CHG shower. It is OK to shave your face.  Please follow these instructions carefully.   Shower the NIGHT BEFORE SURGERY and the MORNING OF SURGERY  If you chose to wash your hair, wash your hair first as usual with your normal shampoo.  After you shampoo, rinse your hair and body thoroughly to remove the shampoo.  Use CHG Soap as you would any other liquid soap. You can apply CHG directly to  the skin and wash gently with a scrungie or a clean washcloth.   Apply the CHG Soap to your body ONLY FROM THE NECK DOWN.  Do not use on open wounds or open sores. Avoid contact with your eyes, ears, mouth and genitals (private parts). Wash Face and genitals (private parts)  with your normal soap.   Wash thoroughly, paying special attention to the area where your surgery will be performed.  Thoroughly rinse your body with warm water from the neck down.  DO NOT shower/wash with your normal soap after using and rinsing off the CHG Soap.  Pat yourself dry with a CLEAN TOWEL.  Wear CLEAN  PAJAMAS to bed the night before surgery  Place CLEAN SHEETS on your bed the night before your surgery  DO NOT SLEEP WITH PETS.   Day of Surgery: Take a shower with CHG soap. Do not wear jewelry or makeup Do not wear lotions, powders, perfumes/colognes, or deodorant. Do not shave 48 hours prior to surgery. Do not bring valuables to the hospital.  [facility] is not responsible for any belongings or valuables. Do not wear nail polish, gel polish, artificial nails, or any other type of covering on natural nails (fingers and toes) If you have artificial nails or gel coating that need to be removed by a nail salon, please have this removed prior to surgery. Artificial nails or gel coating may interfere with anesthesia's ability to adequately monitor your vital signs. Wear Clean/Comfortable clothing the morning of surgery Do not apply any deodorants/lotions.   Remember to brush your teeth WITH YOUR REGULAR TOOTHPASTE.   Please read over the following fact sheets that you were given.  If you received a COVID test during your pre-op visit  it is requested that you wear a mask when out in public, stay away from anyone that may not be feeling well and notify your surgeon if you develop symptoms. If you have been in contact with anyone that has tested positive in the last 10 days please notify you surgeon.

## 2022-07-06 ENCOUNTER — Encounter (HOSPITAL_COMMUNITY): Payer: Self-pay

## 2022-07-06 ENCOUNTER — Other Ambulatory Visit: Payer: Self-pay

## 2022-07-06 ENCOUNTER — Encounter (HOSPITAL_COMMUNITY)
Admission: RE | Admit: 2022-07-06 | Discharge: 2022-07-06 | Disposition: A | Discharge status: Home / Self Care | Payer: 59 | Source: Ambulatory Visit | Attending: Obstetrics and Gynecology | Admitting: Obstetrics and Gynecology

## 2022-07-06 VITALS — BP 129/83 | HR 70 | Temp 97.8°F | Resp 16 | Ht 71.0 in | Wt 196.4 lb

## 2022-07-06 DIAGNOSIS — R339 Retention of urine, unspecified: Secondary | ICD-10-CM | POA: Insufficient documentation

## 2022-07-06 DIAGNOSIS — D219 Benign neoplasm of connective and other soft tissue, unspecified: Secondary | ICD-10-CM | POA: Insufficient documentation

## 2022-07-06 DIAGNOSIS — Z01818 Encounter for other preprocedural examination: Secondary | ICD-10-CM

## 2022-07-06 DIAGNOSIS — Z01812 Encounter for preprocedural laboratory examination: Secondary | ICD-10-CM | POA: Insufficient documentation

## 2022-07-06 LAB — CBC
HCT: 39.9 % (ref 36.0–46.0)
Hemoglobin: 12.9 g/dL (ref 12.0–15.0)
MCH: 28.1 pg (ref 26.0–34.0)
MCHC: 32.3 g/dL (ref 30.0–36.0)
MCV: 86.9 fL (ref 80.0–100.0)
Platelets: 167 10*3/uL (ref 150–400)
RBC: 4.59 MIL/uL (ref 3.87–5.11)
RDW: 14.2 % (ref 11.5–15.5)
WBC: 5 10*3/uL (ref 4.0–10.5)
nRBC: 0 % (ref 0.0–0.2)

## 2022-07-06 LAB — COMPREHENSIVE METABOLIC PANEL
ALT: 11 U/L (ref 0–44)
AST: 16 U/L (ref 15–41)
Albumin: 3.7 g/dL (ref 3.5–5.0)
Alkaline Phosphatase: 76 U/L (ref 38–126)
Anion gap: 6 (ref 5–15)
BUN: 8 mg/dL (ref 6–20)
CO2: 25 mmol/L (ref 22–32)
Calcium: 9 mg/dL (ref 8.9–10.3)
Chloride: 110 mmol/L (ref 98–111)
Creatinine, Ser: 0.8 mg/dL (ref 0.44–1.00)
GFR, Estimated: >60 mL/min (ref >60)
Glucose, Bld: 99 mg/dL (ref 70–99)
Potassium: 4.3 mmol/L (ref 3.5–5.1)
Sodium: 141 mmol/L (ref 135–145)
Total Bilirubin: 0.5 mg/dL (ref 0.3–1.2)
Total Protein: 6.8 g/dL (ref 6.5–8.1)

## 2022-07-06 LAB — TYPE AND SCREEN
ABO/RH(D): B POS
Antibody Screen: NEGATIVE

## 2022-07-06 NOTE — Progress Notes (Signed)
PCP - Vance Peper, NP Cardiologist - Denies Urologist: Dr. Bjorn Loser  PPM/ICD - Denies  Chest x-ray - N/A EKG - N/A Stress Test - Denies ECHO - Denies Cardiac Cath - Denies  Sleep Study - Denies  Diabetes: Denies  Blood Thinner Instructions: N/A Aspirin Instructions: N/A  ERAS Protcol - No  COVID TEST- N/A   Anesthesia review: No  Patient denies shortness of breath, fever, cough and chest pain at PAT appointment   All instructions explained to the patient, with a verbal understanding of the material. Patient agrees to go over the instructions while at home for a better understanding. Patient also instructed to self quarantine after being tested for COVID-19. The opportunity to ask questions was provided.

## 2022-07-14 ENCOUNTER — Other Ambulatory Visit: Payer: Self-pay

## 2022-07-14 ENCOUNTER — Ambulatory Visit (HOSPITAL_COMMUNITY): Payer: 59 | Admitting: Anesthesiology

## 2022-07-14 ENCOUNTER — Encounter (HOSPITAL_COMMUNITY)
Admission: RE | Disposition: A | Discharge status: Home / Self Care | Payer: Self-pay | Source: Home / Self Care | Attending: Obstetrics and Gynecology

## 2022-07-14 ENCOUNTER — Encounter (HOSPITAL_COMMUNITY): Payer: Self-pay | Admitting: Obstetrics and Gynecology

## 2022-07-14 ENCOUNTER — Observation Stay (HOSPITAL_COMMUNITY)
Admission: RE | Admit: 2022-07-14 | Discharge: 2022-07-15 | Disposition: A | Discharge status: Home / Self Care | Payer: 59 | Attending: Obstetrics and Gynecology | Admitting: Obstetrics and Gynecology

## 2022-07-14 ENCOUNTER — Ambulatory Visit (HOSPITAL_BASED_OUTPATIENT_CLINIC_OR_DEPARTMENT_OTHER): Payer: 59 | Admitting: Anesthesiology

## 2022-07-14 DIAGNOSIS — D251 Intramural leiomyoma of uterus: Secondary | ICD-10-CM | POA: Diagnosis not present

## 2022-07-14 DIAGNOSIS — D219 Benign neoplasm of connective and other soft tissue, unspecified: Secondary | ICD-10-CM | POA: Diagnosis not present

## 2022-07-14 DIAGNOSIS — R339 Retention of urine, unspecified: Secondary | ICD-10-CM | POA: Diagnosis present

## 2022-07-14 DIAGNOSIS — D259 Leiomyoma of uterus, unspecified: Principal | ICD-10-CM | POA: Insufficient documentation

## 2022-07-14 DIAGNOSIS — D252 Subserosal leiomyoma of uterus: Secondary | ICD-10-CM

## 2022-07-14 DIAGNOSIS — Z9889 Other specified postprocedural states: Secondary | ICD-10-CM

## 2022-07-14 HISTORY — PX: MYOMECTOMY: SHX85

## 2022-07-14 LAB — POCT PREGNANCY, URINE: Preg Test, Ur: NEGATIVE

## 2022-07-14 SURGERY — MYOMECTOMY, ABDOMINAL APPROACH
Anesthesia: General | Site: Abdomen

## 2022-07-14 MED ORDER — IBUPROFEN 800 MG PO TABS: 800.0000 mg | ORAL_TABLET | Freq: Three times a day (TID) | ORAL | 0 refills | Status: AC | PRN

## 2022-07-14 MED ORDER — DEXAMETHASONE SODIUM PHOSPHATE 10 MG/ML IJ SOLN
INTRAMUSCULAR | Status: AC
  Filled 2022-07-14: qty 1

## 2022-07-14 MED ORDER — ONDANSETRON HCL 4 MG/2ML IJ SOLN
INTRAMUSCULAR | Status: AC
  Filled 2022-07-14: qty 2

## 2022-07-14 MED ORDER — ONDANSETRON HCL 4 MG PO TABS: 4.0000 mg | ORAL_TABLET | Freq: Four times a day (QID) | ORAL | Status: DC | PRN

## 2022-07-14 MED ORDER — PANTOPRAZOLE SODIUM 40 MG PO TBEC
40.0000 mg | DELAYED_RELEASE_TABLET | Freq: Every day | ORAL | Status: DC
  Administered 2022-07-14 – 2022-07-15 (×2): 40 mg via ORAL
  Filled 2022-07-14 (×2): qty 1

## 2022-07-14 MED ORDER — SCOPOLAMINE 1 MG/3DAYS TD PT72
MEDICATED_PATCH | TRANSDERMAL | Status: AC
  Administered 2022-07-14: 1.5 mg via TRANSDERMAL
  Filled 2022-07-14: qty 1

## 2022-07-14 MED ORDER — OXYCODONE HCL 5 MG PO TABS: 5.0000 mg | ORAL_TABLET | ORAL | 0 refills | Status: DC | PRN

## 2022-07-14 MED ORDER — DEXAMETHASONE SODIUM PHOSPHATE 10 MG/ML IJ SOLN
INTRAMUSCULAR | Status: DC | PRN
  Administered 2022-07-14: 10 mg via INTRAVENOUS

## 2022-07-14 MED ORDER — KETOROLAC TROMETHAMINE 15 MG/ML IJ SOLN
15.0000 mg | INTRAMUSCULAR | Status: AC
  Administered 2022-07-14: 15 mg via INTRAVENOUS
  Filled 2022-07-14: qty 1

## 2022-07-14 MED ORDER — OXYCODONE HCL 5 MG PO TABS: 5.0000 mg | ORAL_TABLET | Freq: Once | ORAL | Status: DC | PRN

## 2022-07-14 MED ORDER — POVIDONE-IODINE 10 % EX SWAB
2.0000 | Freq: Once | CUTANEOUS | Status: AC
  Administered 2022-07-14: 2 via TOPICAL

## 2022-07-14 MED ORDER — ROCURONIUM BROMIDE 10 MG/ML (PF) SYRINGE
PREFILLED_SYRINGE | INTRAVENOUS | Status: DC | PRN
  Administered 2022-07-14: 60 mg via INTRAVENOUS

## 2022-07-14 MED ORDER — GABAPENTIN 100 MG PO CAPS
100.0000 mg | ORAL_CAPSULE | Freq: Two times a day (BID) | ORAL | Status: DC
  Administered 2022-07-14 – 2022-07-15 (×3): 100 mg via ORAL
  Filled 2022-07-14 (×3): qty 1

## 2022-07-14 MED ORDER — FENTANYL CITRATE (PF) 250 MCG/5ML IJ SOLN
INTRAMUSCULAR | Status: AC
  Filled 2022-07-14: qty 5

## 2022-07-14 MED ORDER — ORAL CARE MOUTH RINSE: 15.0000 mL | Freq: Once | OROMUCOSAL | Status: AC

## 2022-07-14 MED ORDER — VASOPRESSIN 20 UNIT/ML IV SOLN
INTRAVENOUS | Status: AC
  Filled 2022-07-14: qty 1

## 2022-07-14 MED ORDER — ACETAMINOPHEN 160 MG/5ML PO SOLN: 325.0000 mg | ORAL | Status: DC | PRN

## 2022-07-14 MED ORDER — MENTHOL 3 MG MT LOZG: 1.0000 | LOZENGE | OROMUCOSAL | Status: DC | PRN

## 2022-07-14 MED ORDER — SUGAMMADEX SODIUM 200 MG/2ML IV SOLN
INTRAVENOUS | Status: DC | PRN
  Administered 2022-07-14: 200 mg via INTRAVENOUS

## 2022-07-14 MED ORDER — ACETAMINOPHEN 500 MG PO TABS
1000.0000 mg | ORAL_TABLET | ORAL | Status: AC
  Administered 2022-07-14: 1000 mg via ORAL
  Filled 2022-07-14: qty 2

## 2022-07-14 MED ORDER — OXYCODONE HCL 5 MG PO TABS
5.0000 mg | ORAL_TABLET | ORAL | Status: DC | PRN
  Administered 2022-07-14 – 2022-07-15 (×4): 10 mg via ORAL
  Filled 2022-07-14 (×4): qty 2

## 2022-07-14 MED ORDER — SCOPOLAMINE 1 MG/3DAYS TD PT72: 1.0000 | MEDICATED_PATCH | TRANSDERMAL | Status: DC

## 2022-07-14 MED ORDER — PROPOFOL 10 MG/ML IV BOLUS
INTRAVENOUS | Status: AC
  Filled 2022-07-14: qty 20

## 2022-07-14 MED ORDER — MIDAZOLAM HCL 2 MG/2ML IJ SOLN
INTRAMUSCULAR | Status: AC
  Filled 2022-07-14: qty 2

## 2022-07-14 MED ORDER — ZOLPIDEM TARTRATE 5 MG PO TABS: 5.0000 mg | ORAL_TABLET | Freq: Every evening | ORAL | Status: DC | PRN

## 2022-07-14 MED ORDER — SODIUM CHLORIDE (PF) 0.9 % IJ SOLN
INTRAMUSCULAR | Status: AC
  Filled 2022-07-14: qty 100

## 2022-07-14 MED ORDER — PROPOFOL 10 MG/ML IV BOLUS
INTRAVENOUS | Status: DC | PRN
  Administered 2022-07-14: 180 mg via INTRAVENOUS
  Administered 2022-07-14: 20 mg via INTRAVENOUS

## 2022-07-14 MED ORDER — ONDANSETRON HCL 4 MG/2ML IJ SOLN
INTRAMUSCULAR | Status: DC | PRN
  Administered 2022-07-14: 4 mg via INTRAVENOUS

## 2022-07-14 MED ORDER — FENTANYL CITRATE (PF) 100 MCG/2ML IJ SOLN
INTRAMUSCULAR | Status: AC
  Filled 2022-07-14: qty 2

## 2022-07-14 MED ORDER — ACETAMINOPHEN 500 MG PO TABS: 500.0000 mg | ORAL_TABLET | Freq: Three times a day (TID) | ORAL | 0 refills | Status: DC | PRN

## 2022-07-14 MED ORDER — IBUPROFEN 600 MG PO TABS: 600.0000 mg | ORAL_TABLET | Freq: Four times a day (QID) | ORAL | Status: DC

## 2022-07-14 MED ORDER — BUPIVACAINE LIPOSOME 1.3 % IJ SUSP
INTRAMUSCULAR | Status: DC | PRN
  Administered 2022-07-14: 20 mL

## 2022-07-14 MED ORDER — ACETAMINOPHEN 10 MG/ML IV SOLN: 1000.0000 mg | Freq: Once | INTRAVENOUS | Status: DC | PRN

## 2022-07-14 MED ORDER — ROCURONIUM BROMIDE 10 MG/ML (PF) SYRINGE
PREFILLED_SYRINGE | INTRAVENOUS | Status: AC
  Filled 2022-07-14: qty 10

## 2022-07-14 MED ORDER — BUPIVACAINE HCL (PF) 0.5 % IJ SOLN
INTRAMUSCULAR | Status: DC | PRN
  Administered 2022-07-14: 20 mL

## 2022-07-14 MED ORDER — ACETAMINOPHEN 500 MG PO TABS: 1000.0000 mg | ORAL_TABLET | Freq: Three times a day (TID) | ORAL | Status: DC | PRN

## 2022-07-14 MED ORDER — SOD CITRATE-CITRIC ACID 500-334 MG/5ML PO SOLN
30.0000 mL | ORAL | Status: AC
  Administered 2022-07-14: 30 mL via ORAL
  Filled 2022-07-14: qty 30

## 2022-07-14 MED ORDER — CEFAZOLIN SODIUM-DEXTROSE 2-4 GM/100ML-% IV SOLN
2.0000 g | INTRAVENOUS | Status: AC
  Administered 2022-07-14: 2 g via INTRAVENOUS
  Filled 2022-07-14: qty 100

## 2022-07-14 MED ORDER — LIDOCAINE 2% (20 MG/ML) 5 ML SYRINGE
INTRAMUSCULAR | Status: AC
  Filled 2022-07-14: qty 10

## 2022-07-14 MED ORDER — LIDOCAINE 2% (20 MG/ML) 5 ML SYRINGE
INTRAMUSCULAR | Status: DC | PRN
  Administered 2022-07-14: 100 mg via INTRAVENOUS

## 2022-07-14 MED ORDER — PROMETHAZINE HCL 25 MG/ML IJ SOLN: 6.2500 mg | INTRAMUSCULAR | Status: DC | PRN

## 2022-07-14 MED ORDER — LACTATED RINGERS IV SOLN
INTRAVENOUS | Status: DC
  Administered 2022-07-14: 75 mL/h via INTRAVENOUS

## 2022-07-14 MED ORDER — AMISULPRIDE (ANTIEMETIC) 5 MG/2ML IV SOLN: 10.0000 mg | Freq: Once | INTRAVENOUS | Status: DC | PRN

## 2022-07-14 MED ORDER — ACETAMINOPHEN 325 MG PO TABS: 325.0000 mg | ORAL_TABLET | ORAL | Status: DC | PRN

## 2022-07-14 MED ORDER — MIDAZOLAM HCL 2 MG/2ML IJ SOLN
INTRAMUSCULAR | Status: DC | PRN
  Administered 2022-07-14: 2 mg via INTRAVENOUS

## 2022-07-14 MED ORDER — LACTATED RINGERS IV SOLN: INTRAVENOUS | Status: DC

## 2022-07-14 MED ORDER — FENTANYL CITRATE (PF) 100 MCG/2ML IJ SOLN
25.0000 ug | INTRAMUSCULAR | Status: DC | PRN
  Administered 2022-07-14 (×3): 50 ug via INTRAVENOUS

## 2022-07-14 MED ORDER — FENTANYL CITRATE (PF) 250 MCG/5ML IJ SOLN
INTRAMUSCULAR | Status: DC | PRN
  Administered 2022-07-14 (×2): 50 ug via INTRAVENOUS
  Administered 2022-07-14: 100 ug via INTRAVENOUS

## 2022-07-14 MED ORDER — SIMETHICONE 80 MG PO CHEW: 80.0000 mg | CHEWABLE_TABLET | Freq: Four times a day (QID) | ORAL | Status: DC | PRN

## 2022-07-14 MED ORDER — KETOROLAC TROMETHAMINE 30 MG/ML IJ SOLN
30.0000 mg | Freq: Four times a day (QID) | INTRAMUSCULAR | Status: DC
  Administered 2022-07-14 – 2022-07-15 (×3): 30 mg via INTRAVENOUS
  Filled 2022-07-14 (×3): qty 1

## 2022-07-14 MED ORDER — OXYCODONE HCL 5 MG/5ML PO SOLN: 5.0000 mg | Freq: Once | ORAL | Status: DC | PRN

## 2022-07-14 MED ORDER — CHLORHEXIDINE GLUCONATE 0.12 % MT SOLN
15.0000 mL | Freq: Once | OROMUCOSAL | Status: AC
  Administered 2022-07-14: 15 mL via OROMUCOSAL
  Filled 2022-07-14: qty 15

## 2022-07-14 MED ORDER — ONDANSETRON HCL 4 MG/2ML IJ SOLN: 4.0000 mg | Freq: Four times a day (QID) | INTRAMUSCULAR | Status: DC | PRN

## 2022-07-14 MED ORDER — VASOPRESSIN 20 UNIT/ML IV SOLN
INTRAVENOUS | Status: DC | PRN
  Administered 2022-07-14: 5 mL via INTRAMUSCULAR

## 2022-07-14 MED ORDER — POLYETHYLENE GLYCOL 3350 17 G PO PACK: 17.0000 g | PACK | Freq: Every day | ORAL | Status: DC | PRN

## 2022-07-14 SURGICAL SUPPLY — 27 items
BARRIER ADHS 3X4 INTERCEED (GAUZE/BANDAGES/DRESSINGS) ×1 IMPLANT
DERMABOND ADVANCED .7 DNX12 (GAUZE/BANDAGES/DRESSINGS) ×1 IMPLANT
DRAPE CESAREAN BIRTH W POUCH (DRAPES) ×1 IMPLANT
DURAPREP 26ML APPLICATOR (WOUND CARE) ×2 IMPLANT
GLOVE BIO SURGEON STRL SZ 6 (GLOVE) ×1 IMPLANT
GLOVE BIO SURGEON STRL SZ7 (GLOVE) ×2 IMPLANT
GLOVE BIOGEL PI IND STRL 7.0 (GLOVE) ×4 IMPLANT
GOWN STRL REUS W/ TWL LRG LVL3 (GOWN DISPOSABLE) ×5 IMPLANT
GOWN STRL REUS W/ TWL XL LVL3 (GOWN DISPOSABLE) ×2 IMPLANT
GOWN STRL REUS W/TWL LRG LVL3 (GOWN DISPOSABLE) ×6
GOWN STRL REUS W/TWL XL LVL3 (GOWN DISPOSABLE) ×2
KIT TURNOVER KIT B (KITS) ×2 IMPLANT
NEEDLE HYPO 22GX1.5 SAFETY (NEEDLE) ×2 IMPLANT
NS IRRIG 1000ML POUR BTL (IV SOLUTION) ×2 IMPLANT
PACK ABDOMINAL GYN (CUSTOM PROCEDURE TRAY) ×2 IMPLANT
PAD ARMBOARD 7.5X6 YLW CONV (MISCELLANEOUS) ×2 IMPLANT
PAD OB MATERNITY 4.3X12.25 (PERSONAL CARE ITEMS) ×2 IMPLANT
SUT MNCRL AB 4-0 PS2 18 (SUTURE) ×1 IMPLANT
SUT PLAIN 2 0 (SUTURE) ×2
SUT PLAIN ABS 2-0 CT1 27XMFL (SUTURE) ×1 IMPLANT
SUT VIC AB 0 CT1 27 (SUTURE) ×6
SUT VIC AB 0 CT1 27XBRD ANBCTR (SUTURE) ×5 IMPLANT
SUT VIC AB 2-0 CT1 27 (SUTURE) ×2
SUT VIC AB 2-0 CT1 TAPERPNT 27 (SUTURE) ×2 IMPLANT
SYR CONTROL 10ML LL (SYRINGE) ×2 IMPLANT
TOWEL GREEN STERILE FF (TOWEL DISPOSABLE) ×3 IMPLANT
TRAY FOLEY W/BAG SLVR 14FR (SET/KITS/TRAYS/PACK) ×2 IMPLANT

## 2022-07-14 NOTE — Anesthesia Preprocedure Evaluation (Addendum)
Anesthesia Evaluation  Patient identified by MRN, date of birth, ID band Patient awake    Reviewed: Allergy & Precautions, NPO status , Patient's Chart, lab work & pertinent test results  Airway Mallampati: I  TM Distance: >3 FB Neck ROM: Full    Dental  (+) Teeth Intact, Dental Advisory Given   Pulmonary    breath sounds clear to auscultation       Cardiovascular negative cardio ROS  Rhythm:Regular Rate:Normal     Neuro/Psych  PSYCHIATRIC DISORDERS  Depression       GI/Hepatic negative GI ROS, Neg liver ROS,,,  Endo/Other  negative endocrine ROS    Renal/GU negative Renal ROS     Musculoskeletal negative musculoskeletal ROS (+)    Abdominal   Peds  Hematology negative hematology ROS (+)   Anesthesia Other Findings   Reproductive/Obstetrics                             Anesthesia Physical Anesthesia Plan  ASA: 2  Anesthesia Plan: General   Post-op Pain Management:    Induction: Intravenous  PONV Risk Score and Plan: 4 or greater and Ondansetron, Dexamethasone, Midazolam and Scopolamine patch - Pre-op  Airway Management Planned: Oral ETT  Additional Equipment: None  Intra-op Plan:   Post-operative Plan: Extubation in OR  Informed Consent: I have reviewed the patients History and Physical, chart, labs and discussed the procedure including the risks, benefits and alternatives for the proposed anesthesia with the patient or authorized representative who has indicated his/her understanding and acceptance.     Dental advisory given  Plan Discussed with: CRNA  Anesthesia Plan Comments:        Anesthesia Quick Evaluation

## 2022-07-14 NOTE — Transfer of Care (Signed)
Immediate Anesthesia Transfer of Care Note  Patient: Dominique [name]  Procedure(s) Performed: ABDOMINAL MYOMECTOMY (Abdomen)  Patient Location: PACU  Anesthesia Type:General  Level of Consciousness: awake and alert   Airway & Oxygen Therapy: Patient Spontanous Breathing  Post-op Assessment: Report given to RN and Post -op Vital signs reviewed and stable  Post vital signs: Reviewed and stable  Last Vitals:  Vitals Value Taken Time  BP 133/89 07/14/22 1335  Temp    Pulse 79 07/14/22 1337  Resp 11 07/14/22 1337  SpO2 100 % 07/14/22 1337  Vitals shown include unvalidated device data.  Last Pain:  Vitals:   07/14/22 1134  TempSrc:   PainSc: 5       Patients Stated Pain Goal: 3 (65/80/06 3494)  Complications: No notable events documented.

## 2022-07-14 NOTE — Anesthesia Procedure Notes (Signed)
Procedure Name: Intubation Date/Time: 07/14/2022 12:36 PM  Performed by: Dorann Lodge, CRNAPre-anesthesia Checklist: Patient identified, Emergency Drugs available, Suction available and Patient being monitored Patient Re-evaluated:Patient Re-evaluated prior to induction Oxygen Delivery Method: Circle System Utilized Preoxygenation: Pre-oxygenation with 100% oxygen Induction Type: IV induction Ventilation: Mask ventilation without difficulty Laryngoscope Size: Mac and 3 Grade View: Grade II Tube type: Oral Tube size: 7.0 mm Number of attempts: 1 Airway Equipment and Method: Stylet Placement Confirmation: ETT inserted through vocal cords under direct vision, positive ETCO2 and breath sounds checked- equal and bilateral Secured at: 22 cm Tube secured with: Tape Dental Injury: Teeth and Oropharynx as per pre-operative assessment

## 2022-07-14 NOTE — H&P (Signed)
[facility] Attending History and Physical  Dominique [name] is a 31 y.o. G0P0000 who presented initially with urinary retention found to have a uterine fibroid for myomectomy today.  It does impact her bleeding as well with heavier periods. She desires future child bearing.   Her mom is with her at bedside as her support and is contributing to her history.   Past Medical History:  Diagnosis Date   Chronic pain of left knee    Frequency of urination    History of abnormal cervical Pap smear    History of colitis    was hospitalized in 2017 for infectious colitis,  resolved   History of fracture of right ankle    MDD (major depressive disorder)    Neuropathy    tingling of feet L > right  per pt intermittanly   Urinary retention with incomplete bladder emptying    urologist--- dr Matilde Sprang   Uterine fibroid    Past Surgical History:  Procedure Laterality Date   BRONCHOSCOPY  2010   per pt benign   LAPAROSCOPIC APPENDECTOMY  2011   OB History  Gravida Para Term Preterm AB Living  0 0 0 0 0 0  SAB IAB Ectopic Multiple Live Births  0 0 0 0 0  Patient denies any other pertinent gynecologic issues.  No current facility-administered medications on file prior to encounter.   Current Outpatient Medications on File Prior to Encounter  Medication Sig Dispense Refill   Ascorbic Acid (VITAMIN C) 1000 MG tablet Take 1,000 mg by mouth daily.     Cholecalciferol (VITAMIN D3) 125 MCG (5000 UT) TABS Take 5,000 Units by mouth daily.     Cyanocobalamin (B-12 PO) Take 5,000 mcg by mouth daily.     Probiotic Product (PROBIOTIC DAILY PO) Take 1 tablet by mouth daily.     Tetrahydrozoline HCl (VISINE OP) Apply 1 drop to eye daily as needed (dry eyes).     gabapentin (NEURONTIN) 300 MG capsule Take 1 capsule (300 mg total) by mouth at bedtime. (Patient not taking: Reported on 04/30/2022) 30 capsule 0   Relugolix-Estradiol-Norethind (MYFEMBREE) 40-1-0.5 MG TABS Take 1  tablet by mouth daily. (Patient not taking: Reported on 04/30/2022) 28 tablet 1   tamsulosin (FLOMAX) 0.4 MG CAPS capsule Take 1 capsule (0.4 mg total) by mouth daily. (Patient not taking: Reported on 04/30/2022) 30 capsule 11   Allergies  Allergen Reactions   Ciprofloxacin Hives    Social History:   reports that she has never smoked. She has never been exposed to tobacco smoke. She has never used smokeless tobacco. She reports that she does not currently use alcohol. She reports current drug use. Drug: Marijuana. Family History  Problem Relation Age of Onset   Rheum arthritis Mother    Cancer Father        Lung   Cancer Maternal Grandmother        breast cancer   Diabetes Maternal Grandmother    Cancer Paternal Grandmother        Uterine   Cancer Paternal Grandfather        Lung cancer    Review of Systems: Pertinent items noted in HPI and remainder of comprehensive ROS otherwise negative.  PHYSICAL EXAM: Blood pressure 134/87, pulse 75, temperature 98.8 F (37.1 C), temperature source Oral, resp. rate 18, height '5\' 11"'$  (1.803 m), weight 89.8 kg, last menstrual period 04/28/2022, SpO2 97 %. CONSTITUTIONAL: Well-developed, well-nourished female in no acute distress.  HENT:  Normocephalic,  atraumatic, External right and left ear normal. Oropharynx is clear and moist EYES: Conjunctivae and EOM are normal. Pupils are equal, round, and reactive to light. No scleral icterus.  NECK: Normal range of motion, supple, no masses SKIN: Skin is warm and dry. No rash noted. Not diaphoretic. No erythema. No pallor. NEUROLOGIC: Alert and oriented to person, place, and time. Normal reflexes, muscle tone coordination. No cranial nerve deficit noted. PSYCHIATRIC: Normal mood and affect. Normal behavior. Normal judgment and thought content. CARDIOVASCULAR: Normal heart rate noted, regular rhythm RESPIRATORY: Effort and breath sounds normal, no problems with respiration noted ABDOMEN: Soft, nontender,  nondistended. PELVIC: Not examined MUSCULOSKELETAL: Normal range of motion. No tenderness.  No cyanosis, clubbing, or edema.  2+ distal pulses.  Labs: Results for orders placed or performed during the hospital encounter of 07/14/22 (from the past 336 hour(s))  ABO/Rh   Collection Time: 07/14/22 11:10 AM  Result Value Ref Range   ABO/RH(D)      B POS Performed at [facility], [place] [address] [place], [place] [address]   Results for orders placed or performed during the hospital encounter of 07/06/22 (from the past 336 hour(s))  Type and screen   Collection Time: 07/06/22 11:14 AM  Result Value Ref Range   ABO/RH(D) B POS    Antibody Screen NEG    Sample Expiration 07/20/2022,2359    Extend sample reason      NO TRANSFUSIONS OR PREGNANCY IN THE PAST 3 MONTHS Performed at [facility], [address] [place], [place] [address]   CBC   Collection Time: 07/06/22 11:29 AM  Result Value Ref Range   WBC 5.0 4.0 - 10.5 K/uL   RBC 4.59 3.87 - 5.11 MIL/uL   Hemoglobin 12.9 12.0 - 15.0 g/dL   HCT 39.9 36.0 - 46.0 %   MCV 86.9 80.0 - 100.0 fL   MCH 28.1 26.0 - 34.0 pg   MCHC 32.3 30.0 - 36.0 g/dL   RDW 14.2 11.5 - 15.5 %   Platelets 167 150 - 400 K/uL   nRBC 0.0 0.0 - 0.2 %  Comprehensive metabolic panel per protocol   Collection Time: 07/06/22 11:29 AM  Result Value Ref Range   Sodium 141 135 - 145 mmol/L   Potassium 4.3 3.5 - 5.1 mmol/L   Chloride 110 98 - 111 mmol/L   CO2 25 22 - 32 mmol/L   Glucose, Bld 99 70 - 99 mg/dL   BUN 8 6 - 20 mg/dL   Creatinine, Ser 0.80 0.44 - 1.00 mg/dL   Calcium 9.0 8.9 - 10.3 mg/dL   Total Protein 6.8 6.5 - 8.1 g/dL   Albumin 3.7 3.5 - 5.0 g/dL   AST 16 15 - 41 U/L   ALT 11 0 - 44 U/L   Alkaline Phosphatase 76 38 - 126 U/L   Total Bilirubin 0.5 0.3 - 1.2 mg/dL   GFR, Estimated >60 >60 mL/min   Anion gap 6 5 - 15    Imaging Studies: No results found.  Assessment: Active Problems:   Urine retention   Uterine  fibroid   Plan: - We discussed the option of L/S myomectomy but that there would be a high risk of conversation to open. She has no preference and primarily is focused on having it taken care of safely. After discussing this again today and knowing the size, I recommended proceeding with open myomectomy. Her consent has both on it, but she agrees with going straight to  open myomectomy.  - Risks of surgery include but are not limited to: bleeding, infection, injury to surrounding organs/tissues (i.e. bowel/bladder/ureters), need for additional procedures, wound complications, hospital re-admission, and conversion to open surgery - Importantly we discussed that it is almost definite based on the size of the fibroid that we would recommend a 1LTCS at 37wks afterward due to risk of uterine rupture. Discussed the risks of uterine rupture.  - We discussed postop restrictions, precautions and expectations. Discussed typically an overnight stay if open surgery but if once she is on the floor this evening, she wants to go home, it would be an option.  - Offered referral to [facility] which she declined.  - All questions answered and consent signed.    Radene Gunning, MD, [place] for [facility], [place]

## 2022-07-14 NOTE — Op Note (Signed)
PROCEDURE DATE: 07/14/2022   PREOPERATIVE DIAGNOSES:  Symptomatic fibroid, urinary retention POSTOPERATIVE DIAGNOSES:  Same SURGEON:  Dr. Damita Dunnings ASSISTANT:  Dr. Kennon Rounds.  An experienced assistant was required given the standard of surgical care given the complexity of the case.  This assistant was needed for exposure, dissection, suctioning, retraction, instrument exchange, and for overall help during the procedure.   MEDICAL STUDENT: Shirlyn Goltz, MS-3    OPERATION:  Open/abdominal myomectomy   ANESTHESIA:  General endotracheal, Exparel diluted with bupivicaine (total of 40 cc injected)   INDICATIONS: The patient is a 31 y.o. G1P0010 with the aforementioned diagnoses who desires definitive surgical management. On the preoperative visit, the risks, benefits, indications, and alternatives of the procedure were reviewed with the patient.  On the day of surgery, the risks of surgery were again discussed with the patient including but not limited to: bleeding which may require transfusion or reoperation; infection which may require antibiotics; injury to bowel, bladder, ureters or other surrounding organs; need for additional procedures; thromboembolic phenomenon, incisional problems and other postoperative/anesthesia complications as well as hysterectomy. Written informed consent was obtained.     OPERATIVE FINDINGS: A 15 week size uterus with normal tubes and ovaries bilaterally. Uterus was otherwise normal in appearance. Fibroid was primarily posterior and was almost 15 cm in size. See picture in media. It weights over 500g.    ESTIMATED BLOOD LOSS: 238m FLUIDS:  700 ml of Lactated Ringers URINE OUTPUT:  75 ml of clear yellow urine. SPECIMENS:  Fibroid COMPLICATIONS:  None immediate.     DESCRIPTION OF PROCEDURE:  The patient received intravenous antibiotics and had sequential compression devices applied to her lower extremities while in the preoperative area. She was taken to the operating  room and placed under general anesthesia without difficulty.The abdomen and perineum were prepped and draped in a sterile manner, and she was placed in a dorsal supine position.  A Foley catheter was inserted into the bladder and attached to constant drainage.    After an adequate timeout was performed, a Pfannensteil skin incision was made. This incision was taken down to the fascia using electrocautery with care given to maintain good hemostasis. The fascia was incised in the midline and the fascial incision was then extended bilaterally using electrocautery without difficulty.  The rectus muscles were split bluntly in the midline and the peritoneum entered sharply without complication. This peritoneal incision was then extended superiorly and inferiorly with care given to prevent bowel or bladder injury. Attention was then turned to the pelvis. The uterus was exteriorized through the incision. Important surrounding anatomy structures identified I.e. cornua and care was taken to avoid these structures. Incision was made on the posterior aspect uterus and the fibroid was grasped with Lahey clamps. The fibroid was easily cored out from the uterus bluntly. Care was taken to suture the uterus in layers. The myometrium was noted to be thick deep to where to fibroid has been and she is in fact still a candidate for vaginal delivery in the future. The endometrium was NOT entered.  The uterus was closed in 2 deep layers with 2-0 vicryl in a running fashion. The serosa was closed with a baseball stitch of 2-0 vicryl. One additional figure of eight suture was done to achieve excellent hemostasis.    The uterus was returned to the abdomen once the incision was hemostatic. The incision was inspected once in the pelvis and hemostatic. The pelvis was irrigated. Incision once again inspected and hemostatic. Intercede was placed over  the uterine incision.    All laparotomy sponges and instruments were removed from the  abdomen. The peritoneum was closed with a running stitch of 2-0 plain gut, and the fascia was also closed in a running fashion with 0 vicryl. The subcutaneous layer was irrigated and areas of bleeding were cauterized with the bovie. The subcutaneous layer was reapproximated with 2-0 plain gut. The skin and subcutaneous tissue was numbed with exparel diluted with bupivicaine for a total of 40 cc. The skin was closed with a 4-0 monocryl subcuticular stitch. The incision was dressed with dermabond. Sponge, lap, needle, and instrument counts were correct times two. The patient was taken to the recovery area awake, extubated and in stable condition.     Radene Gunning MD, [place] for [employer], [place]

## 2022-07-15 ENCOUNTER — Encounter (HOSPITAL_COMMUNITY): Payer: Self-pay | Admitting: Obstetrics and Gynecology

## 2022-07-15 DIAGNOSIS — D259 Leiomyoma of uterus, unspecified: Secondary | ICD-10-CM | POA: Diagnosis not present

## 2022-07-15 LAB — CBC
HCT: 32.3 % — ABNORMAL LOW (ref 36.0–46.0)
Hemoglobin: 10.8 g/dL — ABNORMAL LOW (ref 12.0–15.0)
MCH: 28.4 pg (ref 26.0–34.0)
MCHC: 33.4 g/dL (ref 30.0–36.0)
MCV: 85 fL (ref 80.0–100.0)
Platelets: 153 10*3/uL (ref 150–400)
RBC: 3.8 MIL/uL — ABNORMAL LOW (ref 3.87–5.11)
RDW: 14.1 % (ref 11.5–15.5)
WBC: 11.5 10*3/uL — ABNORMAL HIGH (ref 4.0–10.5)
nRBC: 0 % (ref 0.0–0.2)

## 2022-07-15 LAB — ABO/RH: ABO/RH(D): B POS

## 2022-07-15 NOTE — Discharge Summary (Signed)
Dominique Physician Postoperative Discharge Summary  Patient ID: Dominique [name] MRN: [id number] DOB/AGE: 1990-10-26 31 y.o.  Admit Date: 07/14/2022 Discharge Date: 07/15/2022  Preoperative Diagnoses: Symptomatic fibroid  Procedures: Procedure(s): [facility] Hospital Course:  Dominique [name] is a 31 y.o. [name]  admitted for scheduled surgery.  She underwent the procedures as mentioned above, her operation was uncomplicated. For further details about surgery, please refer to the operative report. Patient had an uncomplicated postoperative course. By time of discharge on POD#1, her pain was controlled on oral pain medications; she was ambulating, voiding without difficulty, tolerating regular diet. She was deemed stable for discharge to home.   Significant Labs:    Latest Ref Rng & Units 07/15/2022    5:27 AM 07/06/2022   11:29 AM 10/02/2021    2:04 PM  CBC  WBC 4.0 - 10.5 K/uL 11.5  5.0  6.3   Hemoglobin 12.0 - 15.0 g/dL 10.8  12.9  13.9   Hematocrit 36.0 - 46.0 % 32.3  39.9  42.6   Platelets 150 - 400 K/uL 153  167  119.0     Discharge Exam: Blood pressure (!) 105/50, pulse (!) 45, temperature 98.6 F (37 C), temperature source Oral, resp. rate 18, height '5\' 11"'$  (1.803 m), weight 89.8 kg, last menstrual period 04/28/2022, SpO2 99 %. General appearance: alert and no distress  Resp: clear to auscultation bilaterally  Cardio: regular rate and rhythm  GI: soft, non-tender; bowel sounds normal; no masses, no organomegaly.  Incision: C/D/I, no erythema, no drainage noted Extremities: extremities normal, atraumatic, no cyanosis or edema and Homans sign is negative, no sign of DVT  Discharged Condition: Stable  Disposition: Discharge disposition: 01-Home or Self Care       Discharge Instructions     Activity as tolerated - No restrictions   Complete by: As directed    Call MD for:  difficulty breathing, headache or visual disturbances   Complete by: As directed     Call MD for:  persistant nausea and vomiting   Complete by: As directed    Call MD for:  redness, tenderness, or signs of infection (pain, swelling, redness, odor or green/yellow discharge around incision site)   Complete by: As directed    Call MD for:  severe uncontrolled pain   Complete by: As directed    Call MD for:  temperature >100.4   Complete by: As directed    Diet - low sodium heart healthy   Complete by: As directed    Discharge wound care:   Complete by: As directed    You have dermabond (which is like super glue) over your incisions. You have no additional care for them. The dermabond will dissolve on its own. You may get the incisions wet.   Driving Restrictions   Complete by: As directed    When not taking narcotic pain medication and would not hesitate to use the breaks, usually about 7 days   May shower / Bathe   Complete by: As directed       Allergies as of 07/15/2022       Reactions   Ciprofloxacin Hives        Medication List     STOP taking these medications    gabapentin 300 MG capsule Commonly known as: Neurontin   Myfembree 40-1-0.5 MG Tabs Generic drug: Relugolix-Estradiol-Norethind   tamsulosin 0.4 MG Caps capsule Commonly known as: FLOMAX       TAKE these medications    acetaminophen 500  MG tablet Commonly known as: TYLENOL Take 1 tablet (500 mg total) by mouth every 8 (eight) hours as needed.   B-12 PO Take 5,000 mcg by mouth daily.   ibuprofen 800 MG tablet Commonly known as: ADVIL Take 1 tablet (800 mg total) by mouth 3 (three) times daily with meals as needed for headache, moderate pain or cramping.   oxyCODONE 5 MG immediate release tablet Commonly known as: Oxy IR/ROXICODONE Take 1 tablet (5 mg total) by mouth every 4 (four) hours as needed for severe pain or breakthrough pain.   PROBIOTIC DAILY PO Take 1 tablet by mouth daily.   VISINE OP Apply 1 drop to eye daily as needed (dry eyes).   vitamin C 1000 MG  tablet Take 1,000 mg by mouth daily.   Vitamin D3 125 MCG (5000 UT) Tabs Take 5,000 Units by mouth daily.               Discharge Care Instructions  (From admission, onward)           Start     Ordered   07/15/22 0000  Discharge wound care:       Comments: You have dermabond (which is like super glue) over your incisions. You have no additional care for them. The dermabond will dissolve on its own. You may get the incisions wet.   07/15/22 1209           Future Appointments  Date Time Provider [place]  08/03/2022 10:30 AM MacDiarmid, Nicki Reaper, MD [facility] None    Follow-up [place] for [facility] [place] Follow up.   Specialty: Obstetrics and Dominique Contact information: [address] [place], [place] [phone]                Total discharge time: 20 minutes   Signed:  Radene Gunning, MD, [place] Attending [place], [facility]

## 2022-07-15 NOTE — Plan of Care (Signed)
Patient given discharge instructions and verbalizes understanding.

## 2022-07-15 NOTE — Plan of Care (Signed)
  Problem: Education: Goal: Knowledge of the prescribed therapeutic regimen will improve Outcome: Completed/Met   Problem: Coping: Goal: Level of anxiety will decrease Outcome: Completed/Met   Problem: Elimination: Goal: Will not experience complications related to urinary retention Outcome: Completed/Met

## 2022-07-15 NOTE — Anesthesia Postprocedure Evaluation (Signed)
Anesthesia Post Note  Patient: Dominique [name]  Procedure(s) Performed: ABDOMINAL MYOMECTOMY (Abdomen)     Patient location during evaluation: PACU Anesthesia Type: General Level of consciousness: awake and alert Pain management: pain level controlled Vital Signs Assessment: post-procedure vital signs reviewed and stable Respiratory status: spontaneous breathing, nonlabored ventilation and respiratory function stable Cardiovascular status: blood pressure returned to baseline Postop Assessment: no apparent nausea or vomiting Anesthetic complications: no   No notable events documented.          Marthenia Rolling

## 2022-07-16 LAB — SURGICAL PATHOLOGY

## 2022-07-17 ENCOUNTER — Telehealth: Payer: Self-pay

## 2022-07-17 NOTE — Telephone Encounter (Signed)
Transition Care Management Follow-up Telephone Call Date of discharge and from where: 07/15/22 [facility]. Dx: Myomectomy How have you been since you were released from the hospital? I'm doing ok. Any questions or concerns? No  Items Reviewed: Did the pt receive and understand the discharge instructions provided? Yes  Medications obtained and verified? Yes  Other? No  Any new allergies since your discharge? No  Dietary orders reviewed? No Do you have support at home? Yes   Home Care and Equipment/Supplies: Were home health services ordered? not applicable If so, what is the name of the agency? N/a  Has the agency set up a time to come to the patient's home? not applicable Were any new equipment or medical supplies ordered?  No What is the name of the medical supply agency? N/a Were you able to get the supplies/equipment? not applicable Do you have any questions related to the use of the equipment or supplies? No  Functional Questionnaire: (I = Independent and D = Dependent) ADLs: I  Bathing/Dressing- I  Meal Prep- I  Eating- I  Maintaining continence- I  Transferring/Ambulation- I  Managing Meds- I  Follow up appointments reviewed:  PCP Hospital f/u appt confirmed? Yes  Scheduled to see Vance Peper on 07/31/22 @ 11:20am. [place] [facility] f/u appt confirmed? No  Scheduled to see n/a on n/a @ n/a. Are transportation arrangements needed? No  If their condition worsens, is the pt aware to call PCP or go to the Emergency Dept.? Yes Was the patient provided with contact information for the PCP's office or ED? Yes Was to pt encouraged to call back with questions or concerns? Yes  Angeline Slim, RN, BSN RN Clinical Supervisor [facility] [employer]

## 2022-07-31 ENCOUNTER — Inpatient Hospital Stay: Payer: 59 | Admitting: Nurse Practitioner

## 2022-08-03 ENCOUNTER — Ambulatory Visit: Payer: 59 | Admitting: Urology

## 2022-08-03 ENCOUNTER — Encounter: Payer: Self-pay | Admitting: Urology

## 2022-08-03 VITALS — Ht 71.0 in | Wt 196.0 lb

## 2022-08-03 DIAGNOSIS — R339 Retention of urine, unspecified: Secondary | ICD-10-CM

## 2022-08-03 LAB — BLADDER SCAN AMB NON-IMAGING

## 2022-08-03 NOTE — Progress Notes (Signed)
08/03/2022 10:17 AM   Dominique [name] 03-12-1991 [id number]  Referring provider: [name], NP [place],  [place] [address]  Chief Complaint  Patient presents with   Follow-up    [facility] follow-up    HPI: I was consulted to assess the patient for frequency and incomplete bladder emptying.  Last fall she was voiding 15-20 times a night.  In February she woke up with a full bladder and was very uncomfortable and could not void and she had to void by straining.  Sometimes she cannot void and 1 hour later can.  When she has had retention in the past she has had some tingling in her hands and teeth.  She is voiding every 90 minutes and generally getting up 10-12 times at night.  Flow varies.  Generally is weak.  Sometimes better than others.  She can feel empty even with a high residual like today.  She can sit for 10 minutes or double void a small amount.  She is continent  She has been getting tingling in her feet mainly on the left and more recent on the right.  She is on gabapentin for this.  No vaginal or rectal numbness.  Bowel movements normal   Her recent abdominal ultrasound demonstrated a 9 cm uterine fibroid.  Normal kidneys with no hydronephrosis.  Importantly her prevoid volume was 264 mL and her postvoid volume was 31 mL I believe she is seeing a gynecologist with likely a pelvic ultrasound in July     Mild grade 2 hypermobility bladder neck negative cough this.  Small grade 1 cystocele.  No rectocele.  Normal vaginal sensation   She double void and she was scanned for 220 mL   Patient's presentation is somewhat out of the ordinary.  She has severe nocturia especially for age.  She has high residuals with no hydronephrosis.  She had 1 measured normal residual during the abdominal ultrasound . pathophysiology of emptying well was discussed.  Role of urodynamics and cystoscopy discussed.  She does have some vague tingling.  She understands that rarely a  uterine fibroid could cause obstructive voiding.  I did mention rarely the neurologic system could be involved with some of her symptom complex.  Call if urine culture is positive.  I did not order CT scan.  I will check for position of fibroid relative to the bladder neck during cystoscopy.   Incomplete bladder emptying stable.  Scanned today for 325 mL  Is scheduled for myomectomy of uterine fibroid in September.  Did not have urodynamics due to insurance reasons but has changed.  On pelvic examination mild hypermobility of the bladder neck.  No significant cystocele.  Cystoscopy: Patient underwent flexible cystoscopy.  Bladder mucosa and trigone were normal.  There was a lot of air bubbles.  It may have been more difficult to fill her bladder perhaps from her uterus.  Having said that no significant indentation of the bladder near bladder neck from fibroids     I thought it was best to put her on Flomax and hold off on the urodynamics until we see how she does following her myomectomy.  We can always order the study if she is not significantly better.  TOday Hysterectomy middle of November and she is doing great.  Flow dramatically better.  Feels empty.  No incontinence.  Can hold it for 3 to 4 hours during the day but still goes up 5 times at night which is a great  improvement      PMH: Past Medical History:  Diagnosis Date   Chronic pain of left knee    Frequency of urination    History of abnormal cervical Pap smear    History of colitis    was hospitalized in 2017 for infectious colitis,  resolved   History of fracture of right ankle    MDD (major depressive disorder)    Neuropathy    tingling of feet L > right  per pt intermittanly   Urinary retention with incomplete bladder emptying    urologist--- dr Matilde Sprang   Uterine fibroid     Surgical History: Past Surgical History:  Procedure Laterality Date   BRONCHOSCOPY  2010   per pt benign   LAPAROSCOPIC APPENDECTOMY   2011   MYOMECTOMY  07/14/2022   Procedure: ABDOMINAL MYOMECTOMY;  Surgeon: Radene Gunning, MD;  Location: [facility];  Service: Gynecology;;    Home Medications:  Allergies as of 08/03/2022       Reactions   Ciprofloxacin Hives        Medication List        Accurate as of August 03, 2022 10:17 AM. If you have any questions, ask your nurse or doctor.          acetaminophen 500 MG tablet Commonly known as: TYLENOL Take 1 tablet (500 mg total) by mouth every 8 (eight) hours as needed.   B-12 PO Take 5,000 mcg by mouth daily.   ibuprofen 800 MG tablet Commonly known as: ADVIL Take 1 tablet (800 mg total) by mouth 3 (three) times daily with meals as needed for headache, moderate pain or cramping.   oxyCODONE 5 MG immediate release tablet Commonly known as: Oxy IR/ROXICODONE Take 1 tablet (5 mg total) by mouth every 4 (four) hours as needed for severe pain or breakthrough pain.   PROBIOTIC DAILY PO Take 1 tablet by mouth daily.   VISINE OP Apply 1 drop to eye daily as needed (dry eyes).   vitamin C 1000 MG tablet Take 1,000 mg by mouth daily.   Vitamin D3 125 MCG (5000 UT) Tabs Take 5,000 Units by mouth daily.        Allergies:  Allergies  Allergen Reactions   Ciprofloxacin Hives    Family History: Family History  Problem Relation Age of Onset   Rheum arthritis Mother    Cancer Father        Lung   Cancer Maternal Grandmother        breast cancer   Diabetes Maternal Grandmother    Cancer Paternal Grandmother        Uterine   Cancer Paternal Grandfather        Lung cancer    Social History:  reports that she has never smoked. She has never been exposed to tobacco smoke. She has never used smokeless tobacco. She reports that she does not currently use alcohol. She reports current drug use. Drug: Marijuana.  ROS:                                        Physical Exam: LMP 04/28/2022 (Exact Date)   Constitutional:  Alert and  oriented, No acute distress. HEENT:  AT, moist mucus membranes.  Trachea midline, no masses.   Laboratory Data: Lab Results  Component Value Date   WBC 11.5 (H) 07/15/2022   HGB 10.8 (L) 07/15/2022   HCT  32.3 (L) 07/15/2022   MCV 85.0 07/15/2022   PLT 153 07/15/2022    Lab Results  Component Value Date   CREATININE 0.80 07/06/2022    No results found for: "PSA"  No results found for: "TESTOSTERONE"  Lab Results  Component Value Date   HGBA1C 5.3 10/02/2021    Urinalysis    Component Value Date/Time   APPEARANCEUR Clear 04/06/2022 1109   GLUCOSEU Negative 04/06/2022 1109   BILIRUBINUR Negative 04/06/2022 1109   PROTEINUR Negative 04/06/2022 1109   UROBILINOGEN 0.2 12/09/2021 1510   NITRITE Negative 04/06/2022 1109   LEUKOCYTESUR Negative 04/06/2022 1109    Pertinent Imaging:   Assessment & Plan: Patient doing great.  Residual today 21 mL.  I went over fluid modifications.  In about 4 months she might call and get a physical therapy consultation for residual nocturia.  Otherwise see as needed  1. Urine retention  - BLADDER SCAN AMB NON-IMAGING   No follow-ups on file.  Reece Packer, MD  [place] [address], [place], [place] [address] [phone]

## 2022-08-06 ENCOUNTER — Encounter: Payer: Self-pay | Admitting: Nurse Practitioner

## 2022-08-06 ENCOUNTER — Ambulatory Visit: Payer: 59 | Admitting: Nurse Practitioner

## 2022-08-06 VITALS — BP 130/81 | HR 82 | Temp 97.8°F | Resp 16 | Ht 71.0 in | Wt 194.1 lb

## 2022-08-06 DIAGNOSIS — H6690 Otitis media, unspecified, unspecified ear: Secondary | ICD-10-CM | POA: Diagnosis not present

## 2022-08-06 DIAGNOSIS — R339 Retention of urine, unspecified: Secondary | ICD-10-CM | POA: Diagnosis not present

## 2022-08-06 DIAGNOSIS — Z9889 Other specified postprocedural states: Secondary | ICD-10-CM

## 2022-08-06 DIAGNOSIS — F321 Major depressive disorder, single episode, moderate: Secondary | ICD-10-CM

## 2022-08-06 MED ORDER — AMOXICILLIN-POT CLAVULANATE 875-125 MG PO TABS: 1.0000 | ORAL_TABLET | Freq: Two times a day (BID) | ORAL | 0 refills | Status: DC

## 2022-08-06 MED ORDER — PREDNISONE 20 MG PO TABS: 40.0000 mg | ORAL_TABLET | Freq: Every day | ORAL | 0 refills | Status: DC

## 2022-08-06 NOTE — Patient Instructions (Signed)
It was great to see you!  Start prednisone 2 tablets daily for 5 days in the morning with food for your ear  Start augmentin 1 tablet twice a day for 10 days with food.   Let's follow-up in 6 months, sooner if you have concerns.  If a referral was placed today, you will be contacted for an appointment. Please note that routine referrals can sometimes take up to 3-4 weeks to process. Please call our office if you haven't heard anything after this time frame.  Take care,  Vance Peper, NP

## 2022-08-06 NOTE — Progress Notes (Signed)
Established Patient Office Visit  Subjective   Patient ID: Dominique [name], female    DOB: 1991-04-13  Age: 31 y.o. MRN: [id number]  Chief Complaint  Patient presents with   Follow-up    [facility] 11/14 Myomectomy, Pt states she is doing much better/feeling better   Ear Pain    X1 month, R ear, popping    HPI  Dominique [name] is here to follow-up after hospitalization for myomectomy.  She states that she is doing well since the surgery.  Her incision is healing nicely.  Her urinary retention symptoms are starting to resolve.  She states that about 3 weeks before her surgery, she was laid off from work.  This caused her to feel little bit depressed recently.  She is also going to be looking for a new job soon.  She denies SI/HI.  She has also been experiencing right ear popping and bubbling for the last 4 weeks.  She states that she has had a history of wax buildup before.  She denies fevers, nasal congestion, sore throat.     08/06/2022    9:04 AM 10/02/2021    1:58 PM  Depression screen PHQ 2/9  Decreased Interest 1 1  Down, Depressed, Hopeless 1 1  PHQ - 2 Score 2 2  Altered sleeping 0 3  Tired, decreased energy 1 3  Change in appetite 0 1  Feeling bad or failure about yourself  1 1  Trouble concentrating 0 0  Moving slowly or fidgety/restless 0 0  Suicidal thoughts 0 0  PHQ-9 Score 4 10  Difficult doing work/chores Somewhat difficult Somewhat difficult     ROS See pertinent positives and negatives per HPI.    Objective:     BP 130/81   Pulse 82   Temp 97.8 F (36.6 C) (Oral)   Resp 16   Ht '5\' 11"'$  (1.803 m)   Wt 194 lb 2 oz (88.1 kg)   LMP 07/27/2022 (Exact Date)   SpO2 97%   BMI 27.07 kg/m    Physical Exam Vitals and nursing note reviewed.  Constitutional:      General: She is not in acute distress.    Appearance: Normal appearance.  HENT:     Head: Normocephalic.     Right Ear: Ear canal and external ear normal. A middle ear effusion is present. Tympanic  membrane is injected.     Left Ear: Tympanic membrane, ear canal and external ear normal.  Eyes:     Conjunctiva/sclera: Conjunctivae normal.  Cardiovascular:     Rate and Rhythm: Normal rate and regular rhythm.     Pulses: Normal pulses.     Heart sounds: Normal heart sounds.  Pulmonary:     Effort: Pulmonary effort is normal.     Breath sounds: Normal breath sounds.  Musculoskeletal:     Cervical back: Normal range of motion.  Skin:    General: Skin is warm.     Comments: Incision approximated, no signs of infection  Neurological:     General: No focal deficit present.     Mental Status: She is alert and oriented to person, place, and time.  Psychiatric:        Mood and Affect: Mood normal.        Behavior: Behavior normal.        Thought Content: Thought content normal.        Judgment: Judgment normal.      Assessment & Plan:   Problem List Items Addressed  This Visit       Genitourinary   Urine retention - Primary    Urine retention is improving since having her fibroid removed.  She is still following with urology.  Continue collaboration recommendations.        Other   Depression, major, single episode, moderate ([place])    She states her depression symptoms have worsened recently.  Her PHQ-9 is a 4.  She denies SI/HI.  She is not interested in medication right now, she would like non pharmacological treatments.  Discussed exercise, regular sleep, journaling, meditating to help with symptoms.  Follow-up in 6 months or sooner if symptoms worsen.      H/O myomectomy    She had a myomectomy on 07/14/2022.  She is healing well and her symptoms are improving.  Incision is approximated, no signs infection.      Other Visit Diagnoses     Acute otitis media, unspecified otitis media type       Will treat with augmentin BID x10 days and prednisone '40mg'$  daily x5. Encourage fluids. F/U if not improving   Relevant Medications   amoxicillin-clavulanate (AUGMENTIN) 875-125  MG tablet       Return in about 6 months (around 02/05/2023) for CPE.    Dominique Dancer, NP

## 2022-08-06 NOTE — Assessment & Plan Note (Signed)
She states her depression symptoms have worsened recently.  Her PHQ-9 is a 4.  She denies SI/HI.  She is not interested in medication right now, she would like non pharmacological treatments.  Discussed exercise, regular sleep, journaling, meditating to help with symptoms.  Follow-up in 6 months or sooner if symptoms worsen.

## 2022-08-06 NOTE — Assessment & Plan Note (Signed)
She had a myomectomy on 07/14/2022.  She is healing well and her symptoms are improving.  Incision is approximated, no signs infection.

## 2022-08-06 NOTE — Assessment & Plan Note (Signed)
Urine retention is improving since having her fibroid removed.  She is still following with urology.  Continue collaboration recommendations.

## 2022-08-26 ENCOUNTER — Ambulatory Visit (INDEPENDENT_AMBULATORY_CARE_PROVIDER_SITE_OTHER): Payer: 59 | Admitting: Obstetrics and Gynecology

## 2022-08-26 ENCOUNTER — Encounter: Payer: Self-pay | Admitting: Obstetrics and Gynecology

## 2022-08-26 VITALS — BP 119/79 | HR 91 | Ht 71.0 in | Wt 199.0 lb

## 2022-08-26 DIAGNOSIS — Z9889 Other specified postprocedural states: Secondary | ICD-10-CM

## 2022-08-26 DIAGNOSIS — Z09 Encounter for follow-up examination after completed treatment for conditions other than malignant neoplasm: Secondary | ICD-10-CM

## 2022-08-26 NOTE — Progress Notes (Signed)
Pt has no complaints since surgery

## 2022-08-26 NOTE — Progress Notes (Signed)
   GYNECOLOGY OFFICE VISIT NOTE  History:   Dominique [name] is a 31 y.o. G0P0000 here today for postop check. She is s/p abdominal myomectomy on 11/28. Since surgery she has noted the following: improvement in her skin, her urinary retention and frequency have reduced and she is able to sleep more comfortable.       Past Medical History:  Diagnosis Date   Chronic pain of left knee    Frequency of urination    History of abnormal cervical Pap smear    History of colitis    was hospitalized in 2017 for infectious colitis,  resolved   History of fracture of right ankle    MDD (major depressive disorder)    Neuropathy    tingling of feet L > right  per pt intermittanly    Past Surgical History:  Procedure Laterality Date   BRONCHOSCOPY  2010   per pt benign   LAPAROSCOPIC APPENDECTOMY  2011   MYOMECTOMY  07/14/2022   Procedure: ABDOMINAL MYOMECTOMY;  Surgeon: Radene Gunning, MD;  Location: [place];  Service: Gynecology;;    The following portions of the patient's history were reviewed and updated as appropriate: allergies, current medications, past family history, past medical history, past social history, past surgical history and problem list.   Health Maintenance:   HPV negative 03/12/2021 per [facility] upload  Review of Systems:  Pertinent items noted in HPI and remainder of comprehensive ROS otherwise negative.  Physical Exam:  BP 119/79   Pulse 91   Ht '5\' 11"'$  (1.803 m)   Wt 199 lb (90.3 kg)   LMP 07/27/2022 (Exact Date)   BMI 27.75 kg/m  CONSTITUTIONAL: Well-developed, well-nourished female in no acute distress.  HEENT:  Normocephalic, atraumatic. External right and left ear normal. No scleral icterus.  NECK: Normal range of motion, supple, no masses noted on observation SKIN: No rash noted. Not diaphoretic. No erythema. No pallor. MUSCULOSKELETAL: Normal range of motion. No edema noted. NEUROLOGIC: Alert and oriented to person, place, and time. Normal muscle tone  coordination. No cranial nerve deficit noted. PSYCHIATRIC: Normal mood and affect. Normal behavior. Normal judgment and thought content.  CARDIOVASCULAR: Normal heart rate noted RESPIRATORY: Effort and breath sounds normal, no problems with respiration noted ABDOMEN: No masses noted. No other overt distention noted.  Incision is c/d/I and healing very well.   PELVIC: Deferred  Labs and Imaging No results found for this or any previous visit (from the past 168 hour(s)). No results found.  Assessment and Plan:   1. H/O myomectomy No monitoring needed. Recommended 18 months before TTC.  If she would like birth control, she will send my chart message.   2. Postop check No restrictions, reviewed exercise and gradual introduction of it.    Routine preventative health maintenance measures emphasized. Please refer to After Visit Summary for other counseling recommendations.   Return in about 7 months (around 03/27/2023) for annual.  Radene Gunning, MD, [place], [facility] for [facility], [place]

## 2022-11-13 ENCOUNTER — Encounter: Payer: Self-pay | Admitting: Obstetrics and Gynecology

## 2022-11-13 DIAGNOSIS — Z30011 Encounter for initial prescription of contraceptive pills: Secondary | ICD-10-CM

## 2022-11-16 MED ORDER — NORGESTIM-ETH ESTRAD TRIPHASIC 0.18/0.215/0.25 MG-25 MCG PO TABS: 1.0000 | ORAL_TABLET | Freq: Every day | ORAL | 2 refills | Status: DC

## 2023-02-05 ENCOUNTER — Encounter: Payer: 59 | Admitting: Nurse Practitioner

## 2023-02-22 ENCOUNTER — Other Ambulatory Visit: Payer: Self-pay

## 2023-02-22 ENCOUNTER — Ambulatory Visit (HOSPITAL_COMMUNITY)
Admission: EM | Admit: 2023-02-22 | Discharge: 2023-02-22 | Disposition: A | Discharge status: Home / Self Care | Payer: 59 | Attending: Family Medicine | Admitting: Family Medicine

## 2023-02-22 ENCOUNTER — Ambulatory Visit (INDEPENDENT_AMBULATORY_CARE_PROVIDER_SITE_OTHER): Payer: 59

## 2023-02-22 ENCOUNTER — Encounter (HOSPITAL_COMMUNITY): Payer: Self-pay | Admitting: *Deleted

## 2023-02-22 DIAGNOSIS — S82832A Other fracture of upper and lower end of left fibula, initial encounter for closed fracture: Secondary | ICD-10-CM | POA: Diagnosis not present

## 2023-02-22 DIAGNOSIS — M5416 Radiculopathy, lumbar region: Secondary | ICD-10-CM | POA: Diagnosis not present

## 2023-02-22 MED ORDER — KETOROLAC TROMETHAMINE 30 MG/ML IJ SOLN
30.0000 mg | Freq: Once | INTRAMUSCULAR | Status: AC
  Administered 2023-02-22: 30 mg via INTRAMUSCULAR

## 2023-02-22 MED ORDER — KETOROLAC TROMETHAMINE 30 MG/ML IJ SOLN
INTRAMUSCULAR | Status: AC
  Filled 2023-02-22: qty 1

## 2023-02-22 MED ORDER — ONDANSETRON 4 MG PO TBDP
4.0000 mg | ORAL_TABLET | Freq: Once | ORAL | Status: AC
  Administered 2023-02-22: 4 mg via ORAL

## 2023-02-22 MED ORDER — ONDANSETRON 4 MG PO TBDP
ORAL_TABLET | ORAL | Status: AC
  Filled 2023-02-22: qty 1

## 2023-02-22 MED ORDER — ONDANSETRON 4 MG PO TBDP: 4.0000 mg | ORAL_TABLET | Freq: Three times a day (TID) | ORAL | 0 refills | Status: DC | PRN

## 2023-02-22 MED ORDER — HYDROCODONE-ACETAMINOPHEN 5-325 MG PO TABS: 1.0000 | ORAL_TABLET | Freq: Four times a day (QID) | ORAL | 0 refills | Status: DC | PRN

## 2023-02-22 NOTE — Discharge Instructions (Addendum)
There is a fracture of your fibula at your ankle.  You have been given a shot of Toradol 30 mg today.  Ondansetron dissolved in the mouth every 8 hours as needed for nausea or vomiting. Clear liquids(water, gatorade/pedialyte, ginger ale/sprite, chicken broth/soup) and bland things(crackers/toast, rice, potato, bananas) to eat. Avoid acidic foods like lemon/lime/orange/tomato, and avoid greasy/spicy foods.  Hydrocodone 5 mg--1 tablet every 6 hours as needed for pain.  This is best taken with food.  It can cause sleepiness or dizziness  Please call orthopedics is soon as possible to make an appointment with him. Also follow-up with your primary care
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
## 2023-02-22 NOTE — ED Triage Notes (Addendum)
Pt reports she rooled her ankle today when she stepped off the curb . Pt reports he heara 3 pops in Lt ankle. Pt reports having Nerve pain in the Lt leg and Pt felt a shock wave and felt off balance.

## 2023-02-24 DIAGNOSIS — S93402A Sprain of unspecified ligament of left ankle, initial encounter: Secondary | ICD-10-CM | POA: Insufficient documentation

## 2023-10-13 ENCOUNTER — Telehealth: Payer: Self-pay

## 2023-10-13 NOTE — Transitions of Care (Post Inpatient/ED Visit) (Unsigned)
   10/13/2023  Name: Dominique [name] MRN: [id number] DOB: 30-Mar-1991  Today's TOC FU Call Status: Today's TOC FU Call Status:: Unsuccessful Call (1st Attempt) Unsuccessful Call (1st Attempt) Date: 10/13/23  Attempted to reach the patient regarding the most recent Inpatient/ED visit.  Follow Up Plan: Additional outreach attempts will be made to reach the patient to complete the Transitions of Care (Post Inpatient/ED visit) call.   Signature Arvil Persons, BSN, Charity fundraiser

## 2023-10-15 NOTE — Transitions of Care (Post Inpatient/ED Visit) (Signed)
   10/15/2023  Name: Dominique [name] MRN: [id number] DOB: 1991-07-25  Today's TOC FU Call Status: Today's TOC FU Call Status:: Successful TOC FU Call Completed Unsuccessful Call (1st Attempt) Date: 10/13/23 Unsuccessful Call (2nd Attempt) Date: 10/15/23 [facility] FU Call Complete Date: 10/15/23 Patient's Name and Date of Birth confirmed.  Transition Care Management Follow-up Telephone Call Date of Discharge: 10/12/23 Discharge Facility: Other [facility]) Name of Other (Non[facility]) Discharge Facility: [facility] Type of Discharge: Emergency Department How have you been since you were released from the hospital?: Same Any questions or concerns?: No  Items Reviewed: Did you receive and understand the discharge instructions provided?: Yes Medications obtained,verified, and reconciled?: Yes (Medications Reviewed) Any new allergies since your discharge?: No Dietary orders reviewed?: NA  Medications Reviewed Today: Medications Reviewed Today     Reviewed by Trudee Kuster, CMA (Certified Medical Assistant) on 10/15/23 at 1652  Med List Status: <None>   Medication Order Taking? Sig Documenting Provider Last Dose Status Informant  acetaminophen (TYLENOL) 500 MG tablet [id number] Yes Take 1 tablet (500 mg total) by mouth every 8 (eight) hours as needed. Milas Hock, MD Taking Active   Ascorbic Acid (VITAMIN C) 1000 MG tablet [id number] Yes Take 1,000 mg by mouth daily. [provider] Taking Active Self  Cholecalciferol (VITAMIN D3) 125 MCG (5000 UT) TABS [id number] Yes Take 5,000 Units by mouth daily. [provider] Taking Active Self  Cyanocobalamin (B-12 PO) [id number] Yes Take 5,000 mcg by mouth daily. [provider] Taking Active Self  HYDROcodone-acetaminophen (NORCO/VICODIN) 5-325 MG tablet [id number] Yes Take 1 tablet by mouth every 6 (six) hours as needed (pain). Zenia Resides, MD Taking Active   ibuprofen (ADVIL) 800 MG tablet [id number] Yes Take 1 tablet  (800 mg total) by mouth 3 (three) times daily with meals as needed for headache, moderate pain or cramping. Milas Hock, MD Taking Active            Med Note [facility]   Thu Aug 06, 2022  8:45 AM) PRN  ondansetron (ZOFRAN-ODT) 4 MG disintegrating tablet [id number] Yes Take 1 tablet (4 mg total) by mouth every 8 (eight) hours as needed for nausea or vomiting. Zenia Resides, MD Taking Active   Probiotic Product (PROBIOTIC DAILY PO) [id number] Yes Take 1 tablet by mouth daily. [provider] Taking Active Self  Tetrahydrozoline HCl (VISINE OP) [id number] Yes Apply 1 drop to eye daily as needed (dry eyes). [provider] Taking Active Self            Home Care and Equipment/Supplies: Were Home Health Services Ordered?: NA Any new equipment or medical supplies ordered?: NA  Functional Questionnaire: Do you need assistance with bathing/showering or dressing?: No Do you need assistance with meal preparation?: No Do you need assistance with eating?: No Do you have difficulty maintaining continence: No Do you need assistance with getting out of bed/getting out of a chair/moving?: No Do you have difficulty managing or taking your medications?: No  Follow up appointments reviewed: PCP Follow-up appointment confirmed?: No Specialist Hospital Follow-up appointment confirmed?: NA Do you need transportation to your follow-up appointment?: No Do you understand care options if your condition(s) worsen?: Yes-patient verbalized understanding    Derenda Fennel, RMA

## 2023-10-15 NOTE — Telephone Encounter (Signed)
Copied from CRM [id number]. Topic: Clinical - Medical Advice >> Oct 15, 2023  4:05 PM Corin V wrote: Reason for CRM: Patient returned call to [facility] but unable to reach her for transfer. Please call patient back when available.

## 2023-10-15 NOTE — Transitions of Care (Post Inpatient/ED Visit) (Signed)
   10/15/2023  Name: Dominique [name] MRN: [id number] DOB: [birth year]  Today's TOC FU Call Status: Today's TOC FU Call Status:: Unsuccessful Call (2nd Attempt) Unsuccessful Call (1st Attempt) Date: 10/13/23 Unsuccessful Call (2nd Attempt) Date: 10/15/23  Attempted to reach the patient regarding the most recent Inpatient/ED visit.  Follow Up Plan: No further outreach attempts will be made at this time. We have been unable to contact the patient.  Signature Arvil Persons, BSN, Charity fundraiser

## 2024-03-24 IMAGING — US US RENAL
1 series · 14 of 25 positions shown · non-contrast
Comparison: None.

CLINICAL DATA: Urinary retention.

EXAM:
RENAL / URINARY TRACT ULTRASOUND COMPLETE

[Series 1: us renal · 14 of 35 slices shown]
[im 1/35]
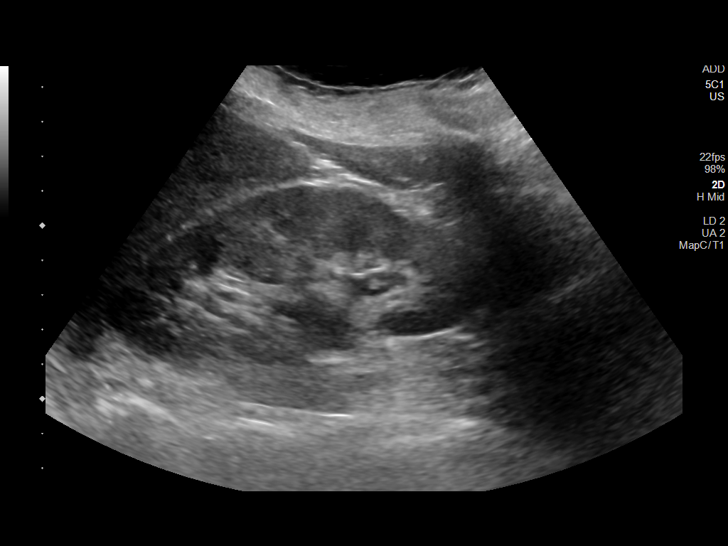
[im 3/35]
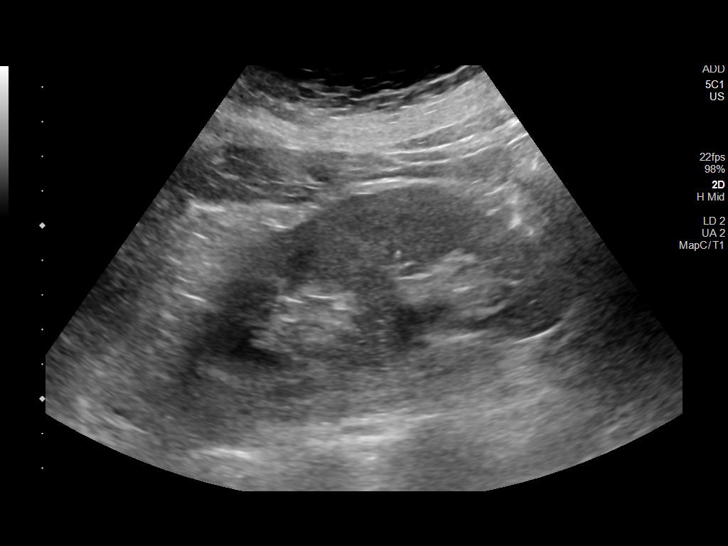
[im 6/35]
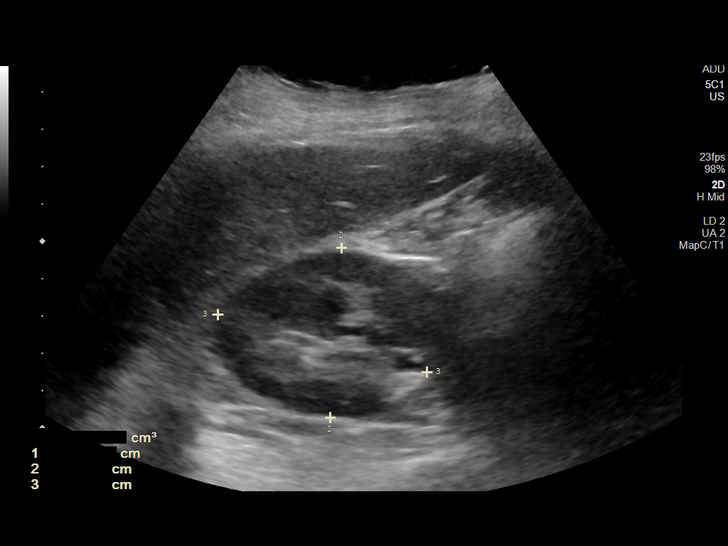
[im 9/35]
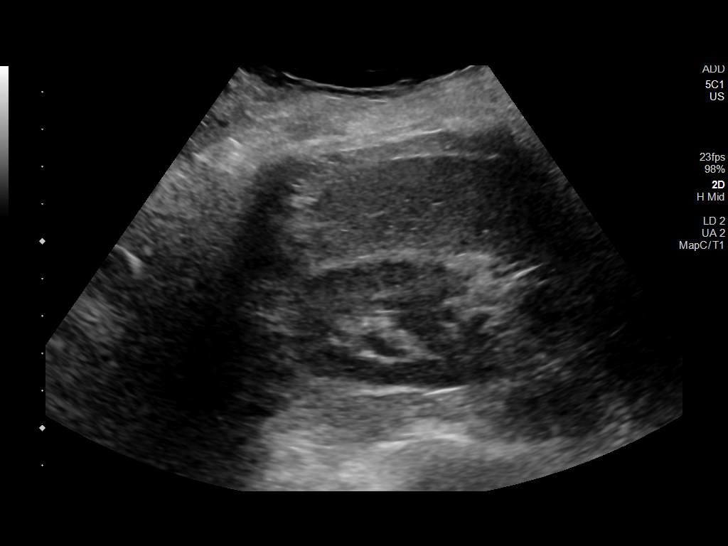
[im 12/35]
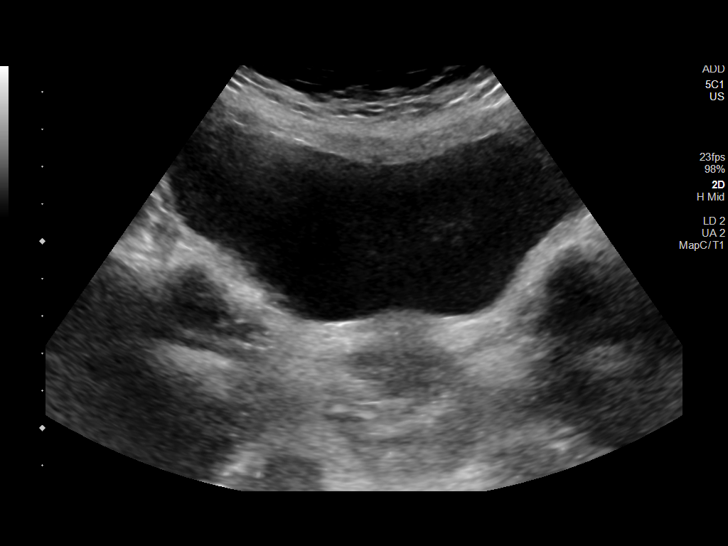
[im 13/35]
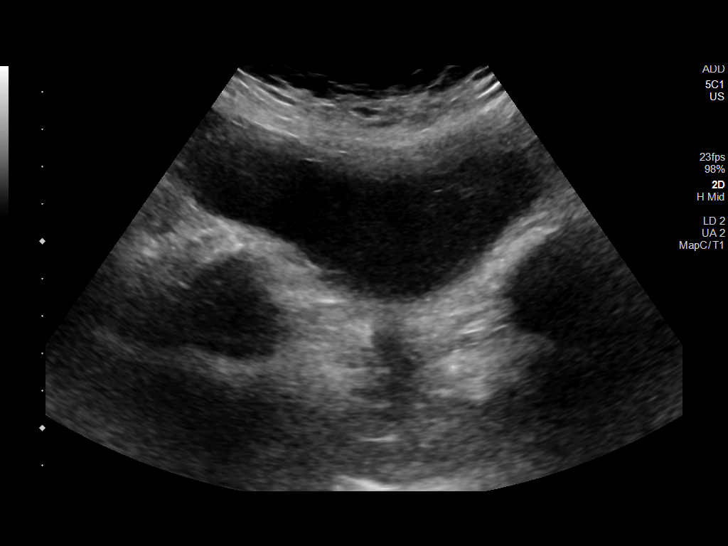
[im 16/35]
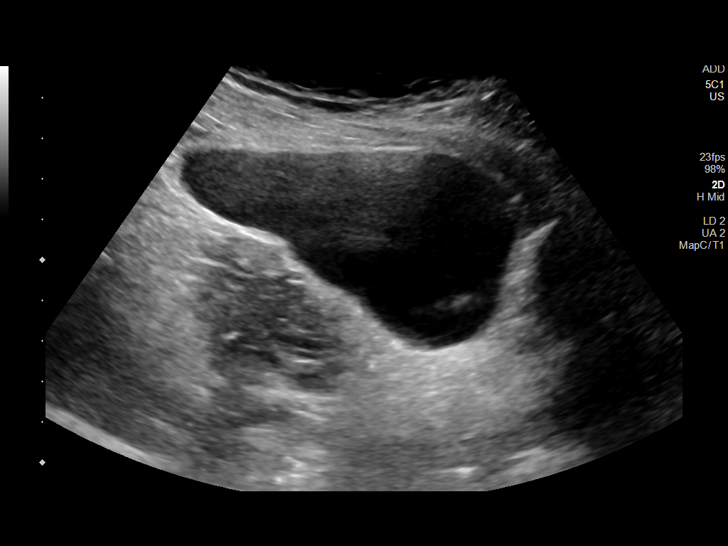
[im 19/35]
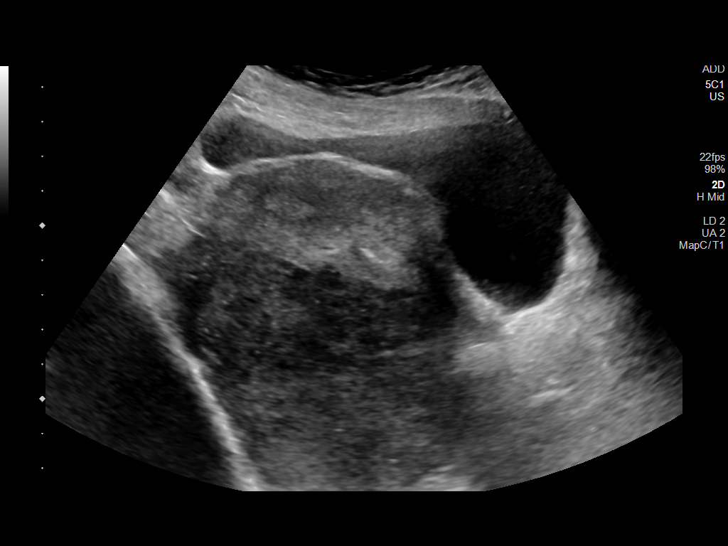
[im 22/35]
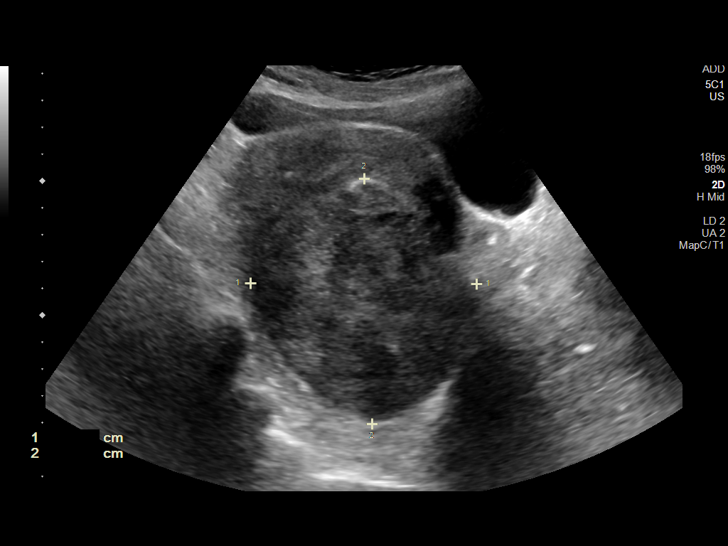
[im 23/35]
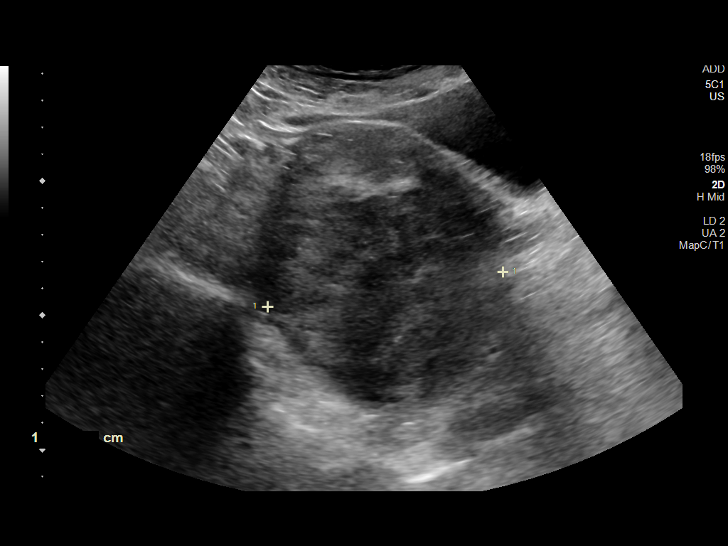
[im 26/35]
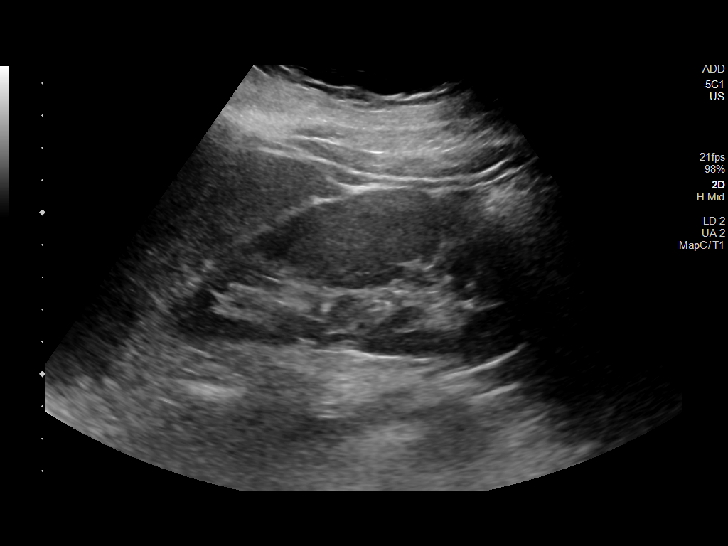
[im 29/35]
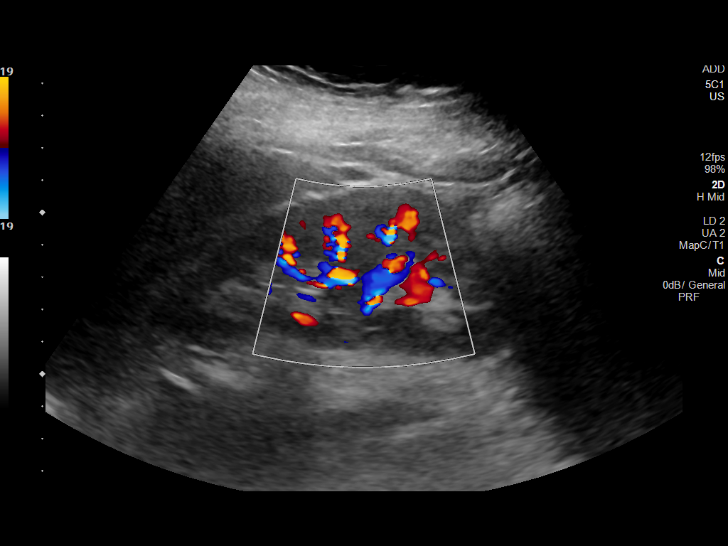
[im 32/35]
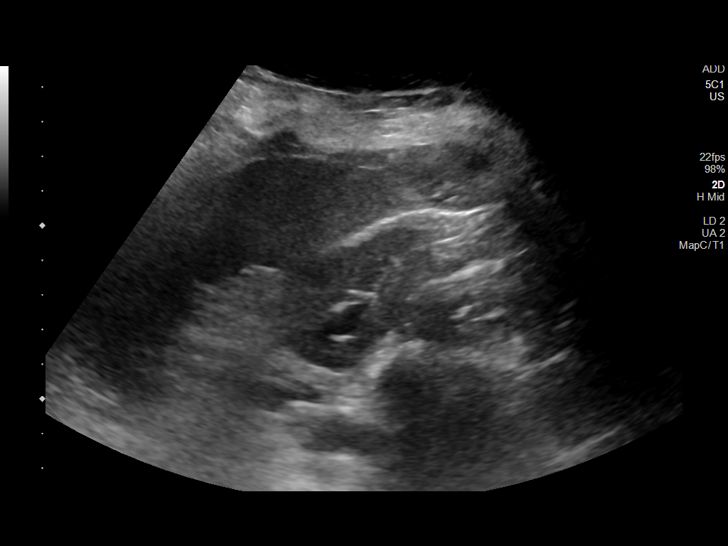
[im 35/35]
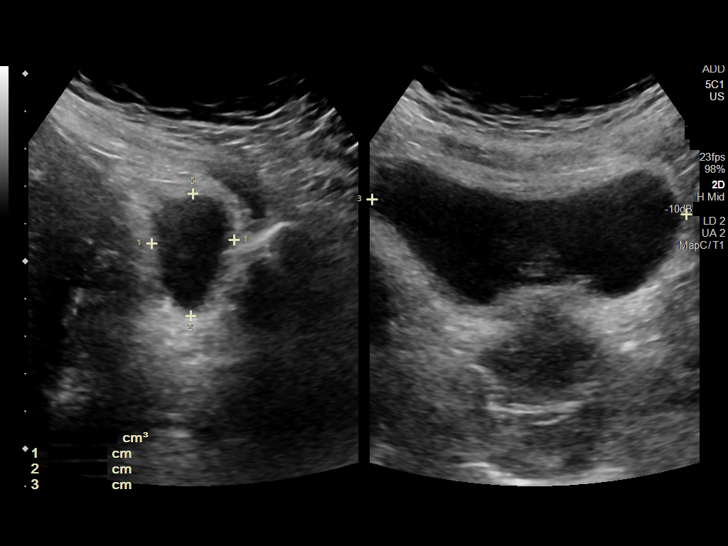

[14 of 25 positions shown; findings below may reference images not displayed]

FINDINGS: Right Kidney:

Renal measurements: 11.0 cm x 4.6 cm x 5.8 cm = volume: 152.8 mL.
Echogenicity within normal limits. No mass or hydronephrosis
visualized.

Left Kidney:

Renal measurements: 11.5 cm x 5.5 cm x 5.2 cm = volume: 171.93 mL.
Echogenicity within normal limits. No mass or hydronephrosis
visualized.

Bladder:

Appears normal for degree of bladder distention, with a prevoid
volume of 264.05 mL and a postvoid volume of 31.5 mL.

Other:

Of incidental note is the presence of an 8.4 cm x 9.1 cm x 8.9 cm
heterogeneous uterine fibroid.
IMPRESSION: 1. Normal ultrasonographic appearance of the kidneys and urinary
bladder.
2. Incidental finding of a large heterogeneous uterine fibroid.
Further evaluation with a dedicated pelvic ultrasound is
recommended.

## 2024-05-11 ENCOUNTER — Encounter: Payer: Self-pay | Admitting: Nurse Practitioner

## 2024-05-11 ENCOUNTER — Ambulatory Visit: Admitting: Nurse Practitioner

## 2024-05-11 VITALS — BP 118/76 | HR 74 | Temp 97.4°F | Ht 71.0 in | Wt 215.4 lb

## 2024-05-11 DIAGNOSIS — Z136 Encounter for screening for cardiovascular disorders: Secondary | ICD-10-CM | POA: Diagnosis not present

## 2024-05-11 DIAGNOSIS — R635 Abnormal weight gain: Secondary | ICD-10-CM | POA: Diagnosis not present

## 2024-05-11 DIAGNOSIS — L689 Hypertrichosis, unspecified: Secondary | ICD-10-CM

## 2024-05-11 DIAGNOSIS — Z131 Encounter for screening for diabetes mellitus: Secondary | ICD-10-CM | POA: Diagnosis not present

## 2024-05-11 DIAGNOSIS — R300 Dysuria: Secondary | ICD-10-CM

## 2024-05-11 LAB — FSH/LH
FSH: 3.4 m[IU]/mL
LH: 2.9 m[IU]/mL

## 2024-05-11 LAB — POC URINALSYSI DIPSTICK (AUTOMATED)
Bilirubin, UA: NEGATIVE
Blood, UA: NEGATIVE
Glucose, UA: NEGATIVE
Ketones, UA: NEGATIVE
Leukocytes, UA: NEGATIVE
Nitrite, UA: NEGATIVE
Protein, UA: NEGATIVE
Spec Grav, UA: 1.025 (ref 1.010–1.025)
Urobilinogen, UA: 0.2 U/dL
pH, UA: 6 (ref 5.0–8.0)

## 2024-05-11 MED ORDER — NITROFURANTOIN MONOHYD MACRO 100 MG PO CAPS: 100.0000 mg | ORAL_CAPSULE | Freq: Two times a day (BID) | ORAL | 0 refills | Status: DC

## 2024-05-11 NOTE — Progress Notes (Unsigned)
   Acute Office Visit  Subjective:     Patient ID: Dominique [name], female    DOB: 03-Sep-1990, 33 y.o.   MRN: [id number]  Chief Complaint  Patient presents with   Urinary Tract Infection    For 1 week, concerns with weight gain    HPI Patient is in today for ***  ROS      Objective:    BP 118/76 (BP Location: Left Arm, Patient Position: Sitting, Cuff Size: Normal)   Pulse 74   Temp (!) 97.4 F (36.3 C)   Ht 5' 11 (1.803 m)   Wt 215 lb 6.4 oz (97.7 kg)   LMP 04/27/2024 (Approximate)   SpO2 98%   BMI 30.04 kg/m  BP Readings from Last 3 Encounters:  05/11/24 118/76  02/22/23 125/84  08/26/22 119/79   Wt Readings from Last 3 Encounters:  05/11/24 215 lb 6.4 oz (97.7 kg)  08/26/22 199 lb (90.3 kg)  08/06/22 194 lb 2 oz (88.1 kg)      Physical Exam  Results for orders placed or performed in visit on 05/11/24  POCT Urinalysis Dipstick (Automated)  Result Value Ref Range   Color, UA     Clarity, UA     Glucose, UA Negative Negative   Bilirubin, UA Negative    Ketones, UA Negative    Spec Grav, UA 1.025 1.010 - 1.025   Blood, UA Negative    pH, UA 6.0 5.0 - 8.0   Protein, UA Negative Negative   Urobilinogen, UA 0.2 0.2 or 1.0 E.U./dL   Nitrite, UA Negative    Leukocytes, UA Negative Negative        Assessment & Plan:   Problem List Items Addressed This Visit   None Visit Diagnoses       Burning with urination    -  Primary   Relevant Orders   POCT Urinalysis Dipstick (Automated) (Completed)       No orders of the defined types were placed in this encounter.   No follow-ups on file.  [name], NP

## 2024-05-11 NOTE — Patient Instructions (Signed)
 It was great to see you!  We are checking your labs today and will let you know the results via mychart/phone.   Start macrobid  twice a day for 5 days   Drink plenty of fluids   Check with your insurance to see if they cover for weight loss:  Contrave Zepbound Tzhncb   You can try berberine supplement to see if this helps with weight  Let's follow-up in 3 months, sooner if you have concerns.  If a referral was placed today, you will be contacted for an appointment. Please note that routine referrals can sometimes take up to 3-4 weeks to process. Please call our office if you haven't heard anything after this time frame.  Take care,  Tinnie Harada, NP

## 2024-05-12 ENCOUNTER — Ambulatory Visit: Payer: Self-pay | Admitting: Nurse Practitioner

## 2024-05-12 DIAGNOSIS — L689 Hypertrichosis, unspecified: Secondary | ICD-10-CM | POA: Insufficient documentation

## 2024-05-12 DIAGNOSIS — R635 Abnormal weight gain: Secondary | ICD-10-CM | POA: Insufficient documentation

## 2024-05-12 DIAGNOSIS — R300 Dysuria: Secondary | ICD-10-CM | POA: Insufficient documentation

## 2024-05-12 LAB — CBC WITH DIFFERENTIAL/PLATELET
Basophils Absolute: 0.1 K/uL (ref 0.0–0.1)
Basophils Relative: 0.9 % (ref 0.0–3.0)
Eosinophils Absolute: 0.3 K/uL (ref 0.0–0.7)
Eosinophils Relative: 3.2 % (ref 0.0–5.0)
HCT: 43.1 % (ref 36.0–46.0)
Hemoglobin: 14.6 g/dL (ref 12.0–15.0)
Lymphocytes Relative: 20.2 % (ref 12.0–46.0)
Lymphs Abs: 1.6 K/uL (ref 0.7–4.0)
MCHC: 34 g/dL (ref 30.0–36.0)
MCV: 89.1 fl (ref 78.0–100.0)
Monocytes Absolute: 0.7 K/uL (ref 0.1–1.0)
Monocytes Relative: 8.6 % (ref 3.0–12.0)
Neutro Abs: 5.4 K/uL (ref 1.4–7.7)
Neutrophils Relative %: 67.1 % (ref 43.0–77.0)
Platelets: 173 K/uL (ref 150.0–400.0)
RBC: 4.84 Mil/uL (ref 3.87–5.11)
RDW: 13.5 % (ref 11.5–15.5)
WBC: 8 K/uL (ref 4.0–10.5)

## 2024-05-12 LAB — LIPID PANEL
Cholesterol: 160 mg/dL (ref 0–200)
HDL: 43.5 mg/dL (ref >39.00)
LDL Cholesterol: 97 mg/dL (ref 0–99)
NonHDL: 116.87
Total CHOL/HDL Ratio: 4
Triglycerides: 99 mg/dL (ref 0.0–149.0)
VLDL: 19.8 mg/dL (ref 0.0–40.0)

## 2024-05-12 LAB — COMPREHENSIVE METABOLIC PANEL WITH GFR
ALT: 13 U/L (ref 0–35)
AST: 16 U/L (ref 0–37)
Albumin: 4.3 g/dL (ref 3.5–5.2)
Alkaline Phosphatase: 70 U/L (ref 39–117)
BUN: 12 mg/dL (ref 6–23)
CO2: 22 meq/L (ref 19–32)
Calcium: 9.2 mg/dL (ref 8.4–10.5)
Chloride: 106 meq/L (ref 96–112)
Creatinine, Ser: 0.75 mg/dL (ref 0.40–1.20)
GFR: 104.4 mL/min (ref >60.00)
Glucose, Bld: 79 mg/dL (ref 70–99)
Potassium: 4 meq/L (ref 3.5–5.1)
Sodium: 136 meq/L (ref 135–145)
Total Bilirubin: 0.4 mg/dL (ref 0.2–1.2)
Total Protein: 7 g/dL (ref 6.0–8.3)

## 2024-05-12 LAB — TESTOSTERONE: Testosterone: 17.44 ng/dL (ref 15.00–40.00)

## 2024-05-12 LAB — HEMOGLOBIN A1C: Hgb A1c MFr Bld: 5.5 % (ref 4.6–6.5)

## 2024-05-12 LAB — TSH: TSH: 0.74 u[IU]/mL (ref 0.35–5.50)

## 2024-05-12 NOTE — Assessment & Plan Note (Signed)
 Weight gain despite an active lifestyle suggests possible hormonal imbalance or thyroid dysfunction, with a family history of thyroid issues noted. Discuss weight loss options including medications and lifestyle interventions, considering cost and side effects. Order labs for TSH, cholesterol, FSH/LH, and testosterone  levels. Discuss weight loss medication options and insurance coverage. Consider referral to a dietitian or weight loss clinic. Advise monitoring insurance coverage for weight loss medications.

## 2024-05-12 NOTE — Assessment & Plan Note (Addendum)
 Symptoms suggest a urinary tract infection despite normal urinalysis, with a differential including recurrence of bladder issues related to previous fibroid. Prescribe macrobid  100mg  BID for suspected UTI. Advise increased fluid intake and recommend OTC Azo for burning sensation.

## 2024-05-12 NOTE — Assessment & Plan Note (Signed)
 Increased hair growth post-surgery raises concern for PCOS, though regular periods reduce likelihood. Further hormonal evaluation is planned. Order hormone level tests including FSH,LH and testosterone .

## 2024-09-20 ENCOUNTER — Ambulatory Visit
Admission: RE | Admit: 2024-09-20 | Discharge: 2024-09-20 | Disposition: A | Discharge status: Home / Self Care | Source: Ambulatory Visit

## 2024-09-20 VITALS — BP 131/83 | HR 66 | Temp 98.0°F | Resp 18

## 2024-09-20 DIAGNOSIS — R6889 Other general symptoms and signs: Secondary | ICD-10-CM

## 2024-09-20 DIAGNOSIS — J069 Acute upper respiratory infection, unspecified: Secondary | ICD-10-CM

## 2024-09-20 LAB — POC SOFIA SARS ANTIGEN FIA: SARS Coronavirus 2 Ag: NEGATIVE

## 2024-09-20 LAB — POCT INFLUENZA A/B
Influenza A, POC: NEGATIVE
Influenza B, POC: NEGATIVE

## 2024-09-20 NOTE — Discharge Instructions (Signed)
 Your rapid influenza and COVID-19 antigen test today was negative.  No further viral testing is indicated. Because these tests are negative and because of the duration of your illness, I believe that you can safely assume that your illness is due to one of the many less serious illnesses circulating in our community right now.     Conservative care is recommended with rest, drinking plenty of clear fluids, eating only when hungry, taking supportive medications for your symptoms and avoiding being around other people.  Please remain at home until you are fever free for 24 hours without the use of antifever medications such as Tylenol  and ibuprofen .   Please read below to learn more about the medications, dosages and frequencies that I recommend to help alleviate your symptoms and to get you feeling better soon:   Advil , Motrin  (ibuprofen ): This is a good anti-inflammatory medication which addresses aches, pains and inflammation of the upper airways that causes sinus and nasal congestion as well as in the lower airways which makes your cough feel tight and sometimes burn.  I recommend that you take  400 mg every 6-8 hours as needed.  Please do not take more than 2400 mg of ibuprofen  in a 24-hour period and please do not take high doses of ibuprofen  for more than 3 days in a row as this can lead to stomach ulcers.   DayQuil Severe Cold and Flu Liquid / Mucinex Max Strength Cold and Flu Liquidgels: These are identical over-the-counter multisymptom medications that contain a fever/pain reliever, acetaminophen  650 mg, a cough suppressant, dextromethorphan 20 mg, an expectorant which loosens congestion to make coughing easier, guaifenesin 400 mg, and a decongestant intended to stop mucous production, phenylephrine 10 mg.  Please keep in mind that the FDA recently stated that phenylephrine is ineffective.  My personal recommendation is to avoid combination medications such as this one and to instead choose your own  combination of the single symptom relievers listed in this summary.  Please be careful when taking this medication along with other medications containing acetaminophen  or Tylenol .  The maximum dose of Tylenol  in a 24-hour period is 3000 mg, taking more than 3000 mg can damage your liver.   NyQuil Severe Cold and Flu Liquid, NyQuil VapoCOOL Caplets / Tylenol  Cold + Flu + Cough Nighttime Liquid: These are identical over-the-counter multisymptom medications that contain a fever/pain reliever, acetaminophen  650 mg, a cough suppressant, dextromethorphan 30 mg, and an antihistamine commonly found and sleep medications such as Unisom and Sleep-Aid that dries out mucus production and helps you sleep, doxylamine 12.5 mg.  Please do not take this medication along with other cough suppressants with the letters DM or with other antihistamines such as Benadryl, Zyrtec, Xyzal, Allegra or Claritin to avoid overdose.  Please be careful when taking this medication along with other medications containing acetaminophen  or Tylenol .  The maximum dose of Tylenol  in a 24-hour period is 3000 mg, taking more than 3000 mg can damage your liver.        If symptoms have not meaningfully improved in the next 3 to 5 days, please return for repeat evaluation or follow-up with your regular provider.  If symptoms have worsened in the next 3 to 5 days, please go to the emergency room for further evaluation.    Thank you for visiting urgent care today.  We appreciate the opportunity to participate in your care.

## 2024-09-20 NOTE — ED Provider Notes (Signed)
 [name]    CSN: [id number] Arrival date & time: 09/20/24  1129    HISTORY   Chief Complaint  Patient presents with   Nasal Congestion   Sore Throat   HPI Dominique [name] is a pleasant, 34 y.o. female who presents to urgent care today. Pt c/o 5 day hx persistent sore throat, otalgia, chills, fatigue, posterior neck pain, head pressure, runny nose, cough productive of small amounts of mucus.  Has not taken any home tests.  Has tried Theraflu, Zicam and elderberry cough syrup w/o relief. Pt has normal VS on arrival today.  Denies fever, body ache, n/v/d, rash, nasal congestion, sinus pain/pressure, difficulty managing secretions, difficulty maintaining airway, known sick contacts.   The history is provided by the patient.  Sore Throat   Past Medical History:  Diagnosis Date   Chronic pain of left knee    Frequency of urination    History of abnormal cervical Pap smear    History of colitis    was hospitalized in 2017 for infectious colitis,  resolved   History of fracture of right ankle    MDD (major depressive disorder)    Neuropathy    tingling of feet L > right  per pt intermittanly   Patient Active Problem List   Diagnosis Date Noted   Burning with urination 05/12/2024   Weight gain 05/12/2024   Excessive hair growth 05/12/2024   Sprain of left ankle 02/24/2023   H/O myomectomy 07/14/2022   Rash 12/09/2021   Depression, major, single episode, moderate (HCC) 10/02/2021   Numbness and tingling of foot 10/02/2021   Chronic pain of left knee 10/02/2021   Past Surgical History:  Procedure Laterality Date   BRONCHOSCOPY  2010   per pt benign   LAPAROSCOPIC APPENDECTOMY  2011   MYOMECTOMY  07/14/2022   Procedure: ABDOMINAL MYOMECTOMY;  Surgeon: Cleatus Moccasin, MD;  Location: [facility] OR;  Service: Gynecology;;   OB History     Gravida  0   Para  0   Term  0   Preterm  0   AB  0   Living  0      SAB  0   IAB  0   Ectopic  0   Multiple  0    Live Births  0          Home Medications    Prior to Admission medications  Medication Sig Start Date End Date Taking? Authorizing Provider  ibuprofen  (ADVIL ) 800 MG tablet Take 1 tablet (800 mg total) by mouth 3 (three) times daily with meals as needed for headache, moderate pain or cramping. 07/14/22  Yes Cleatus Moccasin, MD  Multiple Vitamin (MULTIVITAMIN PO) Take by mouth.   Yes [provider]  acetaminophen  (TYLENOL ) 500 MG tablet Take 1 tablet (500 mg total) by mouth every 8 (eight) hours as needed. Patient not taking: Reported on 05/11/2024 07/14/22   Cleatus Moccasin, MD  Ascorbic Acid (VITAMIN C) 1000 MG tablet Take 1,000 mg by mouth daily. Patient not taking: Reported on 05/11/2024    [provider]  Cholecalciferol (VITAMIN D3) 125 MCG (5000 UT) TABS Take 5,000 Units by mouth daily. Patient not taking: Reported on 05/11/2024    [provider]  Cyanocobalamin  (B-12 PO) Take 5,000 mcg by mouth daily. Patient not taking: Reported on 05/11/2024    [provider]  HYDROcodone -acetaminophen  (NORCO/VICODIN) 5-325 MG tablet Take 1 tablet by mouth every 6 (six) hours as needed (  pain). Patient not taking: Reported on 05/11/2024 02/22/23   Vonna Sharlet POUR, MD  nitrofurantoin , macrocrystal-monohydrate, (MACROBID ) 100 MG capsule Take 1 capsule (100 mg total) by mouth 2 (two) times daily. Patient not taking: Reported on 09/20/2024 05/11/24   McElwee, Tinnie LABOR, NP  ondansetron  (ZOFRAN -ODT) 4 MG disintegrating tablet Take 1 tablet (4 mg total) by mouth every 8 (eight) hours as needed for nausea or vomiting. Patient not taking: Reported on 05/11/2024 02/22/23   Banister, Pamela K, MD  Probiotic Product (PROBIOTIC DAILY PO) Take 1 tablet by mouth daily. Patient not taking: Reported on 05/11/2024    [provider]  Tetrahydrozoline HCl (VISINE OP) Apply 1 drop to eye daily as needed (dry eyes). Patient not taking: Reported on 05/11/2024    [provider]    Family History Family History  Problem Relation Age of Onset   Rheum arthritis Mother    Cancer Father        Lung   Cancer Maternal Grandmother        breast cancer   Diabetes Maternal Grandmother    Cancer Paternal Grandmother        Uterine   Cancer Paternal Grandfather        Lung cancer   Social History Social History[1] Allergies   Ciprofloxacin  Review of Systems Review of Systems Pertinent findings revealed after performing a 14 point review of systems has been noted in the history of present illness.  Physical Exam Vital Signs BP 131/83 (BP Location: Right Arm)   Pulse 66   Temp 98 F (36.7 C) (Oral)   Resp 18   LMP 08/28/2024 (Exact Date)   SpO2 97%   No data found.  Physical Exam Vitals and nursing note reviewed.  Constitutional:      General: She is awake. She is not in acute distress.    Appearance: Normal appearance. She is well-developed and well-groomed. She is not ill-appearing.  HENT:     Head: Normocephalic and atraumatic.     Salivary Glands: Right salivary gland is not diffusely enlarged or tender. Left salivary gland is not diffusely enlarged or tender.     Right Ear: Hearing, tympanic membrane, ear canal and external ear normal.     Left Ear: Hearing, tympanic membrane, ear canal and external ear normal.     Nose: Nose normal.     Right Turbinates: Not enlarged, swollen or pale.     Left Turbinates: Not enlarged, swollen or pale.     Right Sinus: No maxillary sinus tenderness or frontal sinus tenderness.     Left Sinus: No maxillary sinus tenderness or frontal sinus tenderness.     Mouth/Throat:     Lips: Pink. No lesions.     Mouth: Mucous membranes are moist. No oral lesions.     Tongue: No lesions. Tongue does not deviate from midline.     Palate: No mass and lesions.     Pharynx: Uvula midline. Pharyngeal swelling and posterior oropharyngeal erythema present. No oropharyngeal exudate, uvula swelling or postnasal  drip.     Tonsils: No tonsillar exudate. 0 on the right. 0 on the left.  Eyes:     General: Lids are normal.        Right eye: No discharge.        Left eye: No discharge.     Conjunctiva/sclera: Conjunctivae normal.     Right eye: Right conjunctiva is not injected.     Left eye: Left conjunctiva is not  injected.  Neck:     Trachea: Trachea and phonation normal.  Cardiovascular:     Rate and Rhythm: Normal rate and regular rhythm.  Pulmonary:     Effort: Pulmonary effort is normal.     Breath sounds: Normal breath sounds.  Chest:     Chest wall: No tenderness.  Musculoskeletal:        General: Normal range of motion.     Cervical back: Full passive range of motion without pain, normal range of motion and neck supple. Normal range of motion.  Lymphadenopathy:     Head:     Right side of head: No submandibular or tonsillar adenopathy.     Left side of head: No submandibular or tonsillar adenopathy.     Cervical: No cervical adenopathy.     Upper Body:     Right upper body: No supraclavicular adenopathy.     Left upper body: No supraclavicular adenopathy.  Skin:    General: Skin is warm and moist.     Findings: No erythema or rash.  Neurological:     General: No focal deficit present.     Mental Status: She is alert and oriented to person, place, and time. Mental status is at baseline.  Psychiatric:        Attention and Perception: Attention and perception normal.        Mood and Affect: Mood and affect normal.        Speech: Speech normal.        Behavior: Behavior normal. Behavior is cooperative.        Thought Content: Thought content normal.     Visual Acuity Right Eye Distance:   Left Eye Distance:   Bilateral Distance:    Right Eye Near:   Left Eye Near:    Bilateral Near:     [facility] Couse / Diagnostics / Procedures:     Radiology No results found.  Procedures Procedures (including critical care time) EKG  Pending results:  Labs Reviewed  POC SOFIA SARS  ANTIGEN FIA  POCT INFLUENZA A/B    Medications Ordered in UC: Medications - No data to display  UC Diagnoses / Final Clinical Impressions(s)   I have reviewed the triage vital signs and the nursing notes.  Pertinent labs & imaging results that were available during my care of the patient were reviewed by me and considered in my medical decision making (see chart for details).    Final diagnoses:  Feeling unwell  Viral URI with cough   COVID-19 and influenza testing today was negative, pt advised.  Recommend continue use of supportive medications to alleviate sx, duration of illness and complications of illness discussed with pt. Conservative care recommended, return precautions advised.   Please see discharge instructions below for details of plan of care as provided to patient. ED Prescriptions   None    PDMP not reviewed this encounter.    Discharge Instructions      Your rapid influenza and COVID-19 antigen test today was negative.  No further viral testing is indicated. Because these tests are negative and because of the duration of your illness, I believe that you can safely assume that your illness is due to one of the many less serious illnesses circulating in our community right now.     Conservative care is recommended with rest, drinking plenty of clear fluids, eating only when hungry, taking supportive medications for your symptoms and avoiding being around other people.  Please remain at home  until you are fever free for 24 hours without the use of antifever medications such as Tylenol  and ibuprofen .   Please read below to learn more about the medications, dosages and frequencies that I recommend to help alleviate your symptoms and to get you feeling better soon:   Advil , Motrin  (ibuprofen ): This is a good anti-inflammatory medication which addresses aches, pains and inflammation of the upper airways that causes sinus and nasal congestion as well as in the lower  airways which makes your cough feel tight and sometimes burn.  I recommend that you take  400 mg every 6-8 hours as needed.  Please do not take more than 2400 mg of ibuprofen  in a 24-hour period and please do not take high doses of ibuprofen  for more than 3 days in a row as this can lead to stomach ulcers.   DayQuil Severe Cold and Flu Liquid / Mucinex Max Strength Cold and Flu Liquidgels: These are identical over-the-counter multisymptom medications that contain a fever/pain reliever, acetaminophen  650 mg, a cough suppressant, dextromethorphan 20 mg, an expectorant which loosens congestion to make coughing easier, guaifenesin 400 mg, and a decongestant intended to stop mucous production, phenylephrine 10 mg.  Please keep in mind that the FDA recently stated that phenylephrine is ineffective.  My personal recommendation is to avoid combination medications such as this one and to instead choose your own combination of the single symptom relievers listed in this summary.  Please be careful when taking this medication along with other medications containing acetaminophen  or Tylenol .  The maximum dose of Tylenol  in a 24-hour period is 3000 mg, taking more than 3000 mg can damage your liver.   NyQuil Severe Cold and Flu Liquid, NyQuil VapoCOOL Caplets / Tylenol  Cold + Flu + Cough Nighttime Liquid: These are identical over-the-counter multisymptom medications that contain a fever/pain reliever, acetaminophen  650 mg, a cough suppressant, dextromethorphan 30 mg, and an antihistamine commonly found and sleep medications such as Unisom and Sleep-Aid that dries out mucus production and helps you sleep, doxylamine 12.5 mg.  Please do not take this medication along with other cough suppressants with the letters DM or with other antihistamines such as Benadryl, Zyrtec, Xyzal, Allegra or Claritin to avoid overdose.  Please be careful when taking this medication along with other medications containing acetaminophen  or  Tylenol .  The maximum dose of Tylenol  in a 24-hour period is 3000 mg, taking more than 3000 mg can damage your liver.        If symptoms have not meaningfully improved in the next 3 to 5 days, please return for repeat evaluation or follow-up with your regular provider.  If symptoms have worsened in the next 3 to 5 days, please go to the emergency room for further evaluation.    Thank you for visiting urgent care today.  We appreciate the opportunity to participate in your care.       Disposition Upon Discharge:  Condition: stable for discharge home  Patient presented with an acute illness with associated systemic symptoms and significant discomfort requiring urgent management. In my opinion, this is a condition that a prudent lay person (someone who possesses an average knowledge of health and medicine) may potentially expect to result in complications if not addressed urgently such as respiratory distress, impairment of bodily function or dysfunction of bodily organs.   Routine symptom specific, illness specific and/or disease specific instructions were discussed with the patient and/or caregiver at length.   As such, the patient has been evaluated and  assessed, work-up was performed and treatment was provided in alignment with urgent care protocols and evidence based medicine.  Patient/parent/caregiver has been advised that the patient may require follow up for further testing and treatment if the symptoms continue in spite of treatment, as clinically indicated and appropriate.  Patient/parent/caregiver has been advised to return to the [facility] or PCP if no better; to PCP or the Emergency Department if new signs and symptoms develop, or if the current signs or symptoms continue to change or worsen for further workup, evaluation and treatment as clinically indicated and appropriate  The patient will follow up with their current PCP if and as advised. If the patient does not currently have a PCP we  will assist them in obtaining one.   The patient may need specialty follow up if the symptoms continue, in spite of conservative treatment and management, for further workup, evaluation, consultation and treatment as clinically indicated and appropriate.  Patient/parent/caregiver verbalized understanding and agreement of plan as discussed.  All questions were addressed during visit.  Please see discharge instructions below for further details of plan.  This office note has been dictated using Teaching laboratory technician.  Unfortunately, this method of dictation can sometimes lead to typographical or grammatical errors.  I apologize for your inconvenience in advance if this occurs.  Please do not hesitate to reach out to me if clarification is needed.       [1]  Social History Tobacco Use   Smoking status: Never    Passive exposure: Past   Smokeless tobacco: Never  Vaping Use   Vaping status: Never Used  Substance Use Topics   Alcohol use: Not Currently   Drug use: Yes    Types: Marijuana    Comment: 4x per week     Joesph Shaver Scales, PA-C 09/20/24 1212  "

## 2024-09-20 NOTE — ED Triage Notes (Signed)
 Since Friday she has been having a constant sore throat, chills, fatigue, neck pain, body aches, head pressure, runny nose, and fatigue. No testing has been done. She has tried medications with some relief including Therealfu, Zicon, and elderberry cough syrup.
